# Patient Record
Sex: Male | Born: 1970 | Race: White | Hispanic: No | Marital: Married | Smoking: Never smoker
Health system: Southern US, Community
[De-identification: ages and names within clinical notes are randomized; demographics above are authoritative.]

## PROBLEM LIST (undated history)

## (undated) DIAGNOSIS — M545 Low back pain, unspecified: Secondary | ICD-10-CM

## (undated) DIAGNOSIS — G8929 Other chronic pain: Secondary | ICD-10-CM

## (undated) DIAGNOSIS — Z9109 Other allergy status, other than to drugs and biological substances: Secondary | ICD-10-CM

## (undated) HISTORY — DX: Low back pain, unspecified: M54.50

## (undated) HISTORY — DX: Low back pain: M54.5

## (undated) HISTORY — PX: NO PAST SURGERIES: SHX2092

## (undated) HISTORY — DX: Other chronic pain: G89.29

---

## 2002-01-07 ENCOUNTER — Encounter: Payer: Self-pay | Admitting: Emergency Medicine

## 2002-01-07 ENCOUNTER — Emergency Department (HOSPITAL_COMMUNITY): Admission: EM | Admit: 2002-01-07 | Discharge: 2002-01-08 | Payer: Self-pay | Admitting: Emergency Medicine

## 2007-05-14 ENCOUNTER — Emergency Department (HOSPITAL_COMMUNITY): Admission: EM | Admit: 2007-05-14 | Discharge: 2007-05-14 | Payer: Self-pay | Admitting: Emergency Medicine

## 2012-02-28 ENCOUNTER — Encounter (HOSPITAL_BASED_OUTPATIENT_CLINIC_OR_DEPARTMENT_OTHER): Payer: Self-pay | Admitting: *Deleted

## 2012-02-28 ENCOUNTER — Emergency Department (INDEPENDENT_AMBULATORY_CARE_PROVIDER_SITE_OTHER): Payer: BC Managed Care – PPO

## 2012-02-28 ENCOUNTER — Emergency Department (HOSPITAL_BASED_OUTPATIENT_CLINIC_OR_DEPARTMENT_OTHER)
Admission: EM | Admit: 2012-02-28 | Discharge: 2012-02-28 | Disposition: A | Discharge status: Home / Self Care | Payer: BC Managed Care – PPO | Attending: Emergency Medicine | Admitting: Emergency Medicine

## 2012-02-28 DIAGNOSIS — S0190XA Unspecified open wound of unspecified part of head, initial encounter: Secondary | ICD-10-CM

## 2012-02-28 DIAGNOSIS — R220 Localized swelling, mass and lump, head: Secondary | ICD-10-CM

## 2012-02-28 DIAGNOSIS — W2209XA Striking against other stationary object, initial encounter: Secondary | ICD-10-CM

## 2012-02-28 DIAGNOSIS — S0100XA Unspecified open wound of scalp, initial encounter: Secondary | ICD-10-CM | POA: Insufficient documentation

## 2012-02-28 DIAGNOSIS — S0101XA Laceration without foreign body of scalp, initial encounter: Secondary | ICD-10-CM

## 2012-02-28 DIAGNOSIS — J45909 Unspecified asthma, uncomplicated: Secondary | ICD-10-CM | POA: Insufficient documentation

## 2012-02-28 MED ORDER — LIDOCAINE-EPINEPHRINE-TETRACAINE (LET) SOLUTION
3.0000 mL | Freq: Once | NASAL | Status: AC
  Administered 2012-02-28: 3 mL via TOPICAL
  Filled 2012-02-28 (×2): qty 3

## 2012-02-28 MED ORDER — LIDOCAINE-EPINEPHRINE 2 %-1:100000 IJ SOLN
INTRAMUSCULAR | Status: AC
  Administered 2012-02-28: 17:00:00 via SUBCUTANEOUS
  Filled 2012-02-28: qty 1

## 2012-02-28 MED ORDER — TETANUS-DIPHTH-ACELL PERTUSSIS 5-2.5-18.5 LF-MCG/0.5 IM SUSP
INTRAMUSCULAR | Status: AC
  Filled 2012-02-28: qty 0.5

## 2012-02-28 MED ORDER — TETANUS-DIPHTH-ACELL PERTUSSIS 5-2.5-18.5 LF-MCG/0.5 IM SUSP
0.5000 mL | Freq: Once | INTRAMUSCULAR | Status: AC
  Administered 2012-02-28: 0.5 mL via INTRAMUSCULAR

## 2012-02-28 NOTE — ED Provider Notes (Signed)
History     CSN: [id number]  Arrival date & time 02/28/12  1538   First MD Initiated Contact with Patient 02/28/12 1541      Chief Complaint  Patient presents with  . Head Laceration    Pt. had no LOC and reports hitting the top of the head with a doorframe    (Consider location/radiation/quality/duration/timing/severity/associated sxs/prior treatment) Patient is a 41 y.o. male presenting with scalp laceration. The history is provided by the patient. No language interpreter was used.  Head Laceration This is a new problem. The current episode started today. The problem occurs constantly. The problem has been unchanged. Associated symptoms comments: bleeding. The symptoms are aggravated by nothing. He has tried nothing for the symptoms. The treatment provided no relief.  Pt hit head on doorframe.  Pt was on lawnmower.  Pt reports had panic attack at [facility].  Pt feels better now.  Pt reports he hates doctors  Past Medical History  Diagnosis Date  . Asthma     No past surgical history on file.  No family history on file.  History  Substance Use Topics  . Smoking status: Not on file  . Smokeless tobacco: Not on file  . Alcohol Use:       Review of Systems  Skin: Positive for wound.  All other systems reviewed and are negative.    Allergies  Review of patient's allergies indicates no known allergies.  Home Medications   Current Outpatient Rx  Name Route Sig Dispense Refill  . DM-GUAIFENESIN ER 30-600 MG PO TB12 Oral Take 1 tablet by mouth every 12 (twelve) hours.      BP 139/67  Pulse 64  Temp(Src) 98.2 F (36.8 C) (Oral)  Resp 20  Ht 5\' 10"  (1.778 m)  Wt 280 lb (127.007 kg)  BMI 40.18 kg/m2  SpO2 99%  Physical Exam  Nursing note and vitals reviewed. Constitutional: He is oriented to person, place, and time. He appears well-developed and well-nourished.  HENT:  Head: Normocephalic and atraumatic.  Left Ear: External ear normal.  Nose: Nose normal.   Mouth/Throat: Oropharynx is clear and moist.  Eyes: Pupils are equal, round, and reactive to light.  Neurological: He is alert and oriented to person, place, and time.  Skin: Skin is warm.       3cm laceration anterior scalp    ED Course  LACERATION REPAIR Performed by: Elson Areas Authorized by: Elson Areas Consent: Verbal consent obtained. Risks and benefits: risks, benefits and alternatives were discussed Consent given by: patient Patient understanding: patient states understanding of the procedure being performed Time out: Immediately prior to procedure a "time out" was called to verify the correct patient, procedure, equipment, support staff and site/side marked as required. Body area: head/neck Laceration length: 3 cm Foreign bodies: no foreign bodies Tendon involvement: none Nerve involvement: none Anesthesia: local infiltration Local anesthetic: lidocaine 2% with epinephrine Patient sedated: no Preparation: Patient was prepped and draped in the usual sterile fashion. Irrigation solution: saline Amount of cleaning: standard Debridement: none Skin closure: staples Number of sutures: 4 Patient tolerance: Patient tolerated the procedure well with no immediate complications.   (including critical care time)  Labs Reviewed - No data to display Ct Head Wo Contrast  02/28/2012  *RADIOLOGY REPORT*  Clinical Data: Head laceration  CT HEAD WITHOUT CONTRAST  Technique:  Contiguous axial images were obtained from the base of the skull through the vertex without contrast.  Comparison: None.  Findings:  A  bandage overlies the midline of the forehead.  This finding is associated with a minimal amount of soft tissue stranding (image 26, series 2).  No calvarial fracture.  No radiopaque foreign body. Gray white differentiation is maintained.  No CT evidence of acute large territory infarct.  No intraparenchymal or extra-axial mass or hemorrhage.  Normal size and configuration of  the ventricles and basilar cisterns.  No midline shift.  The paranasal sinuses and mastoid air cells are normal.  IMPRESSION: Minimal soft tissue swelling about the forehead without associated radiopaque foreign body, calvarial fracture or acute intracranial process.  Original Report Authenticated By: Waynard Reeds, M.D.     No diagnosis found.    MDM  Ct obtained due to possible loc.  Normal per radiologist.         Elson Areas, PA 02/28/12 1711

## 2012-02-28 NOTE — ED Notes (Signed)
No LOC with injury to top of head.  Noted scalp wound to the top of the head with controlled bleeding.  Pt. Reports he felt like he lost his vision for a second or two.  Pt. In no distress and did have a panic attack at [facility].

## 2012-02-28 NOTE — Discharge Instructions (Signed)
Head Injury, Adult You have had a head injury that does not appear serious at this time. A concussion is a state of changed mental ability, usually from a blow to the head. You should take clear liquids for the rest of the day and then resume your regular diet. You should not take sedatives or alcoholic beverages for as long as directed by your caregiver after discharge. After injuries such as yours, most problems occur within the first 24 hours. SYMPTOMS These minor symptoms may be experienced after discharge:  Memory difficulties.   Dizziness.   Headaches.   Double vision.   Hearing difficulties.   Depression.   Tiredness.   Weakness.   Difficulty with concentration.  If you experience any of these problems, you should not be alarmed. A concussion requires a few days for recovery. Many patients with head injuries frequently experience such symptoms. Usually, these problems disappear without medical care. If symptoms last for more than one day, notify your caregiver. See your caregiver sooner if symptoms are becoming worse rather than better. HOME CARE INSTRUCTIONS   During the next 24 hours you must stay with someone who can watch you for the warning signs listed below.  Although it is unlikely that serious side effects will occur, you should be aware of signs and symptoms which may necessitate your return to this location. Side effects may occur up to 7 - 10 days following the injury. It is important for you to carefully monitor your condition and contact your caregiver or seek immediate medical attention if there is a change in your condition. SEEK IMMEDIATE MEDICAL CARE IF:   There is confusion or drowsiness.   You can not awaken the injured person.   There is nausea (feeling sick to your stomach) or continued, forceful vomiting.   You notice dizziness or unsteadiness which is getting worse, or inability to walk.   You have convulsions or unconsciousness.   You experience  severe, persistent headaches not relieved by over-the-counter or prescription medicines for pain. (Do not take aspirin as this impairs clotting abilities). Take other pain medications only as directed.   You can not use arms or legs normally.   There is clear or bloody discharge from the nose or ears.  MAKE SURE YOU:   Understand these instructions.   Will watch your condition.   Will get help right away if you are not doing well or get worse.  Document Released: 11/05/2005 Document Revised: 10/25/2011 Document Reviewed: 09/23/2009 [facility] Patient Information 2012 [place], [place].Staple Care and Removal Your caregiver has used staples today to repair your wound. Staples are used to help a wound heal faster by holding the edges of the wound together. The staples can be removed when the wound has healed well enough to stay together after the staples are removed. A dressing (wound covering), depending on the location of the wound, may have been applied. This may be changed once per day or as instructed. If the dressing sticks, it may be soaked off with soapy water or hydrogen peroxide. Only take over-the-counter or prescription medicines for pain, discomfort, or fever as directed by your caregiver.  If you did not receive a tetanus shot today because you did not recall when your last one was given, check with your caregiver when you have your staples removed to determine if one is needed. Return to your caregiver's office in 1 week or as suggested to have your staples removed. SEEK IMMEDIATE MEDICAL CARE IF:   You  have redness, swelling, or increasing pain in the wound.   You have pus coming from the wound.   You have a fever.   You notice a bad smell coming from the wound or dressing.   Your wound edges break open after staples have been removed.  Document Released: 07/31/2001 Document Revised: 10/25/2011 Document Reviewed: 08/15/2005 [facility] Patient Information 2012 [place], [place].

## 2012-02-28 NOTE — ED Notes (Signed)
Pt. Was seen at [facility] and having a total panic attack when the Dr. Was in to see the Pt.

## 2012-02-29 NOTE — ED Provider Notes (Signed)
Medical screening examination/treatment/procedure(s) were performed by non-physician practitioner and as supervising physician I was immediately available for consultation/collaboration.  Cyndra Numbers, MD 02/29/12 [phone]

## 2012-03-07 ENCOUNTER — Emergency Department (HOSPITAL_BASED_OUTPATIENT_CLINIC_OR_DEPARTMENT_OTHER)
Admission: EM | Admit: 2012-03-07 | Discharge: 2012-03-07 | Disposition: A | Discharge status: Home / Self Care | Payer: BC Managed Care – PPO | Attending: Emergency Medicine | Admitting: Emergency Medicine

## 2012-03-07 ENCOUNTER — Encounter (HOSPITAL_BASED_OUTPATIENT_CLINIC_OR_DEPARTMENT_OTHER): Payer: Self-pay

## 2012-03-07 DIAGNOSIS — Z4802 Encounter for removal of sutures: Secondary | ICD-10-CM | POA: Insufficient documentation

## 2012-03-07 HISTORY — DX: Other allergy status, other than to drugs and biological substances: Z91.09

## 2012-03-07 NOTE — ED Notes (Signed)
Pt states that he presents today for staple removal, states that the site remains tender.

## 2012-03-07 NOTE — ED Notes (Signed)
Pt was found in the waiting room arguing with registration clerk about having to pay another emergency room visit charge to have his staples removed.  Pt was informed that this was standard procedure that the he was welcome to check out and go to a local urgent care if he preferred to avoid the emergency room copayment for treatment today.  Pt states that he wanted to proceed with the visit and have the staples removed.  Pt continued to be argumentative with this RN and NP while triage procedure was taking place.  NP chairside in triage removing staples during triage process.

## 2012-03-07 NOTE — ED Provider Notes (Signed)
History     CSN: [id number]  Arrival date & time 03/07/12  1600   First MD Initiated Contact with Patient 03/07/12 1606      Chief Complaint  Patient presents with  . Suture / Staple Removal    (Consider location/radiation/quality/duration/timing/severity/associated sxs/prior treatment) Patient is a 41 y.o. male presenting with suture removal. The history is provided by the patient. No language interpreter was used.  Suture / Staple Removal  The sutures were placed 7 to 10 days ago. Treatments since wound repair include regular soap and water washings. His temperature was unmeasured prior to arrival. There has been no drainage from the wound. There is no redness present. There is no swelling present. The pain has improved. He has no difficulty moving the affected extremity or digit.    Past Medical History  Diagnosis Date  . Asthma   . Environmental allergies     History reviewed. No pertinent past surgical history.  History reviewed. No pertinent family history.  History  Substance Use Topics  . Smoking status: Never Smoker   . Smokeless tobacco: Never Used  . Alcohol Use: No      Review of Systems  Respiratory: Negative.   Cardiovascular: Negative.   Skin: Positive for wound.    Allergies  Review of patient's allergies indicates no known allergies.  Home Medications   Current Outpatient Rx  Name Route Sig Dispense Refill  . ASCORBIC ACID 250 MG PO CHEW Oral Chew 250 mg by mouth daily.    Marland Kitchen DM-GUAIFENESIN ER 30-600 MG PO TB12 Oral Take 1 tablet by mouth every 12 (twelve) hours.      BP 133/93  Pulse 66  Temp 97.5 F (36.4 C)  Resp 20  Ht 5\' 10"  (1.778 m)  Wt 270 lb (122.471 kg)  BMI 38.74 kg/m2  SpO2 97%  Physical Exam  Nursing note and vitals reviewed. Cardiovascular: Normal rate and regular rhythm.   Pulmonary/Chest: Effort normal and breath sounds normal.  Skin:       Well healing wound to the frontal scalp    ED Course  Procedures  (including critical care time)  Labs Reviewed - No data to display No results found.   1. Removal of staple       MDM  4 staples removed without any problem        Teressa Lower, NP 03/07/12 1623

## 2012-03-07 NOTE — Discharge Instructions (Signed)
Staple Removal  Care After  The staples used to close your skin have been removed. The wound needs continued care so it can heal completely and without problems. The care described here will need to be done for another 5-10 days unless your caregiver advises otherwise.   HOME CARE INSTRUCTIONS    Keep wound site dry and clean.   If skin adhesive strips were applied after the staples were removed, they will begin to peel off in a few days. If they remain after fourteen days, they may be peeled off and discarded.   If you still have a dressing, change it at least once a day or as instructed by your caregiver. If the bandage sticks, soak it off with warm water. Pat dry with a clean towel. Look for signs of infection (see below).   Reapply cream or ointment according to your caregiver's instruction. This will help prevent infection and keep the bandage from sticking. Use of a non-stick material over the wound and under the dressing or wrap will also help keep the bandage from sticking.   If the bandage becomes wet, dirty or develops a foul smell, change it as soon as possible.   New scars become sunburned easily. Use sunscreens with protection factor (SPF) of at least 15 when out in the sun.   Only take over-the-counter or prescription medicines for pain, discomfort or fever as directed by your caregiver.  SEEK IMMEDIATE MEDICAL CARE IF:    There is redness, swelling or increasing pain in the wound.   Pus is coming from the wound.   An unexplained oral temperature above 102 F (38.9 C) develops.   You notice a foul smell coming from the wound or dressing.   There is a breaking open of the suture line (edges not staying together) of the wound edges after staples have been removed.  Document Released: 10/18/2008 Document Revised: 10/25/2011 Document Reviewed: 10/18/2008  [employer] Patient Information 2012 [employer].

## 2012-03-08 NOTE — ED Provider Notes (Signed)
Medical screening examination/treatment/procedure(s) were performed by non-physician practitioner and as supervising physician I was immediately available for consultation/collaboration.   Carleene Cooper III, MD 03/08/12 1235

## 2012-06-24 ENCOUNTER — Other Ambulatory Visit: Payer: Self-pay | Admitting: Internal Medicine

## 2012-06-24 ENCOUNTER — Ambulatory Visit: Payer: BC Managed Care – PPO

## 2012-06-24 ENCOUNTER — Ambulatory Visit (INDEPENDENT_AMBULATORY_CARE_PROVIDER_SITE_OTHER): Payer: BC Managed Care – PPO | Admitting: Internal Medicine

## 2012-06-24 ENCOUNTER — Encounter: Payer: Self-pay | Admitting: Internal Medicine

## 2012-06-24 VITALS — BP 126/88 | HR 73 | Temp 98.2°F | Resp 16 | Ht 69.0 in | Wt 284.6 lb

## 2012-06-24 DIAGNOSIS — B351 Tinea unguium: Secondary | ICD-10-CM

## 2012-06-24 DIAGNOSIS — J45909 Unspecified asthma, uncomplicated: Secondary | ICD-10-CM | POA: Insufficient documentation

## 2012-06-24 DIAGNOSIS — M25572 Pain in left ankle and joints of left foot: Secondary | ICD-10-CM

## 2012-06-24 DIAGNOSIS — J309 Allergic rhinitis, unspecified: Secondary | ICD-10-CM | POA: Insufficient documentation

## 2012-06-24 DIAGNOSIS — M25579 Pain in unspecified ankle and joints of unspecified foot: Secondary | ICD-10-CM

## 2012-06-24 DIAGNOSIS — Z6841 Body Mass Index (BMI) 40.0 and over, adult: Secondary | ICD-10-CM | POA: Insufficient documentation

## 2012-06-24 MED ORDER — TERBINAFINE HCL 250 MG PO TABS: 250.0000 mg | ORAL_TABLET | Freq: Every day | ORAL | Status: AC

## 2012-06-24 MED ORDER — ALBUTEROL SULFATE HFA 108 (90 BASE) MCG/ACT IN AERS: 2.0000 | INHALATION_SPRAY | Freq: Four times a day (QID) | RESPIRATORY_TRACT | Status: DC | PRN

## 2012-06-24 MED ORDER — MELOXICAM 15 MG PO TABS: 15.0000 mg | ORAL_TABLET | Freq: Every day | ORAL | Status: AC

## 2012-06-24 NOTE — Progress Notes (Signed)
  Subjective:    Patient ID: Mark Friedman, male    DOB: 22-Nov-1970, 41 y.o.   MRN: [id number]  HPI Complaining of pain in the foot and ankle on and off for the past several months/more recently he stepped funny and now Has pain as he walks or rolls his foot/ worse as the day goes on/job weightbearing No swelling/no history of gout or arthritis/continues to be overweight Prior injury years ago  Review of Systems No other joint abnormalities Still uses medicines when necessary for asthma and allergies Has been gaining weight again over the past 3 months as he has been less mobile No fever or night sweats    Objective:   Physical Exam Vital signs stable The left ankle has good range of motion without pain but inversion and eversion creates pain along the proximal metatarsals Very tender over metatarsals 2,3 and 4 at the base Mild crepitus with tendon movement No swelling or redness Has evidence of peripheral vascular disease with microhemorrhages and hyperpigmentation but has full peripheral pulses Has fungus on 3 toenails of each foot without cellulitis      [facility] reading (PRIMARY) by  Dr. Josephina Gip fx seen foot or ankle    Assessment & Plan:

## 2012-06-24 NOTE — Addendum Note (Signed)
Addended by: Tonye Pearson on: 06/24/2012 01:54 PM   Modules accepted: Orders

## 2013-03-29 IMAGING — CT CT HEAD W/O CM
1 series · 16 of 30 positions shown, 20 images · non-contrast
Comparison: None.

CLINICAL DATA: Head laceration

CT HEAD WITHOUT CONTRAST
TECHNIQUE: Contiguous axial images were obtained from the base of
the skull through the vertex without contrast.

[Series 2: head 4.8 h37s · axial · 0.49mm/px · z∈[+1086,+1242]mm · 16 of 36 slices shown, 20 images]
[im 2/36  brain]
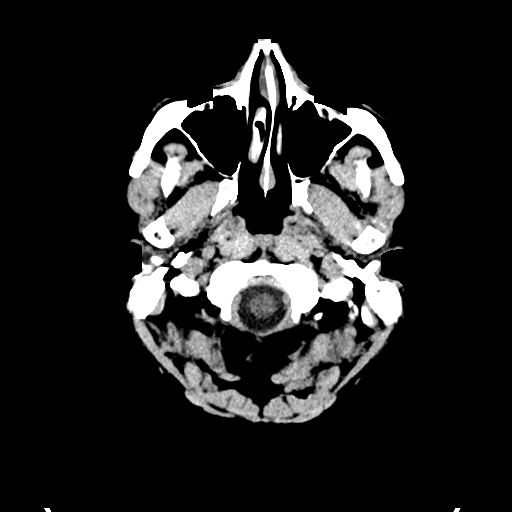
[im 2/36  bone]
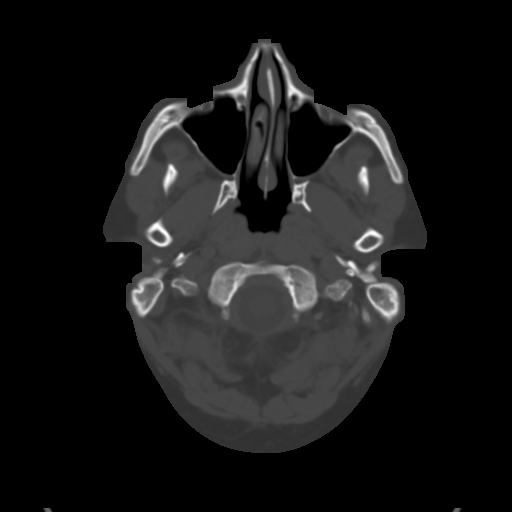
[im 4/36  brain]
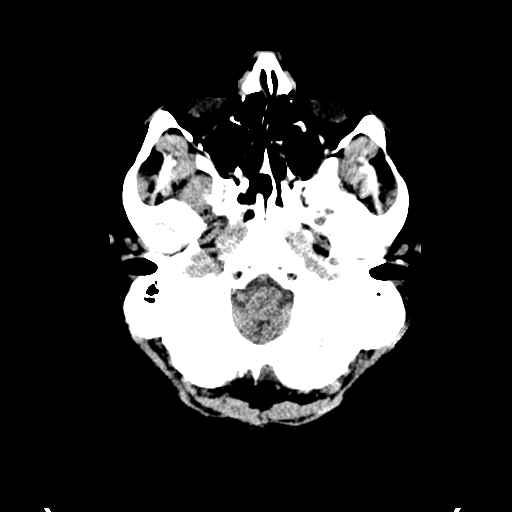
[im 7/36  brain]
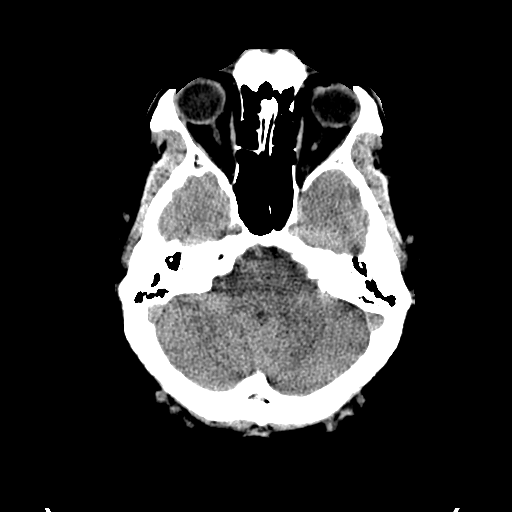
[im 9/36  brain]
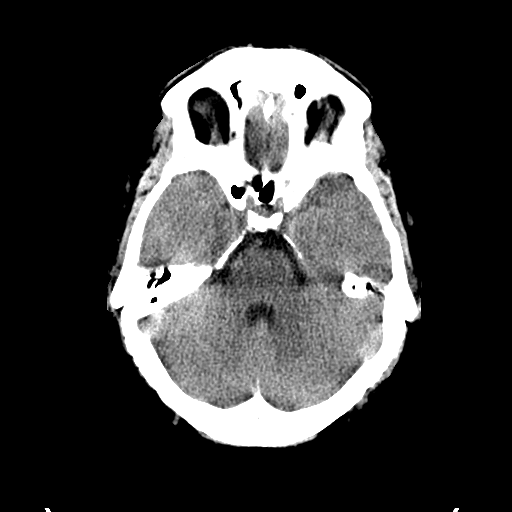
[im 10/36  brain]
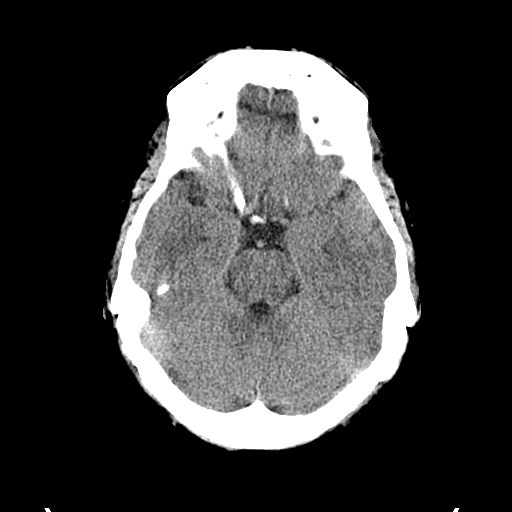
[im 10/36  bone]
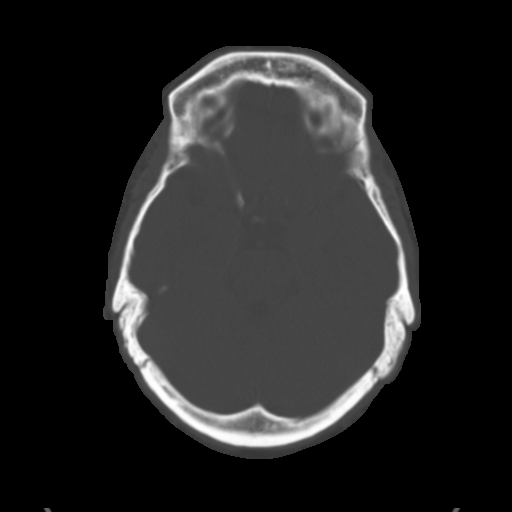
[im 13/36  brain]
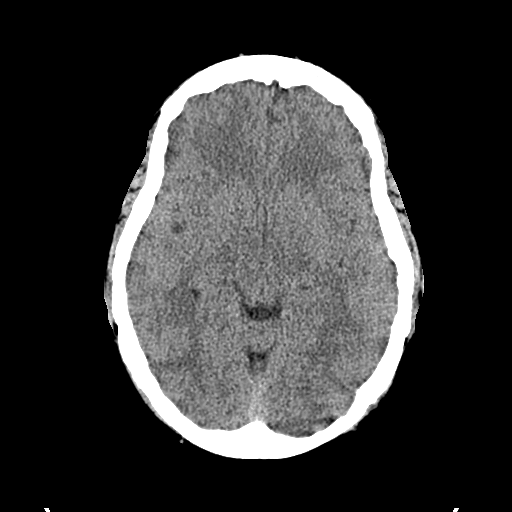
[im 15/36  brain]
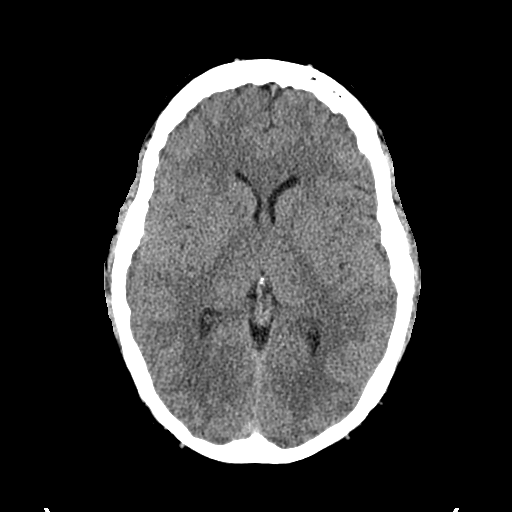
[im 17/36  brain]
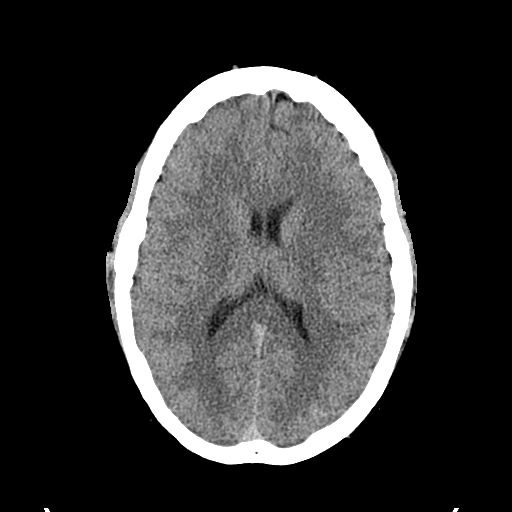
[im 19/36  brain]
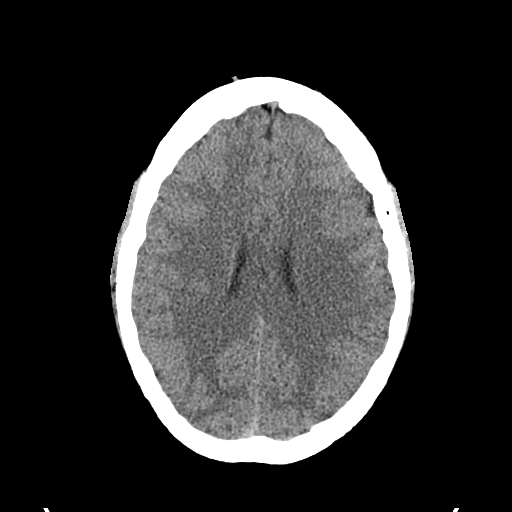
[im 19/36  bone]
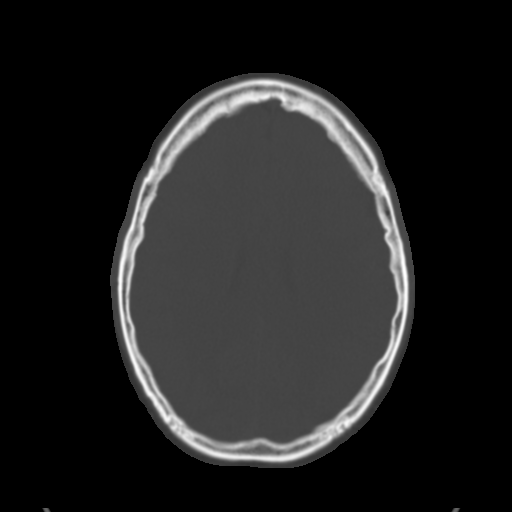
[im 21/36  brain]
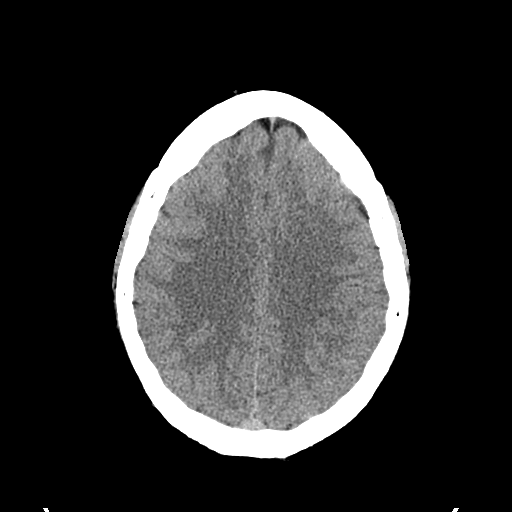
[im 23/36  brain]
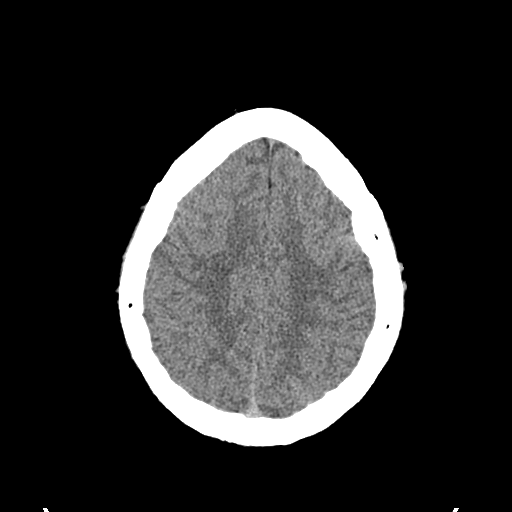
[im 26/36  brain]
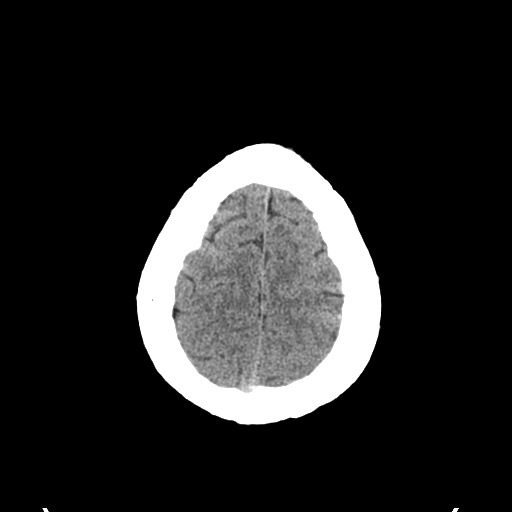
[im 27/36  brain]
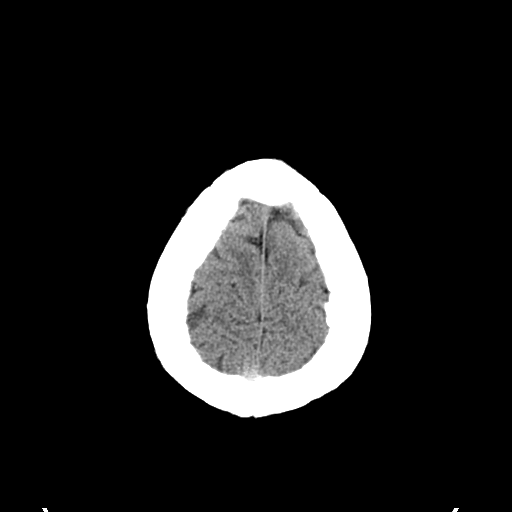
[im 27/36  bone]
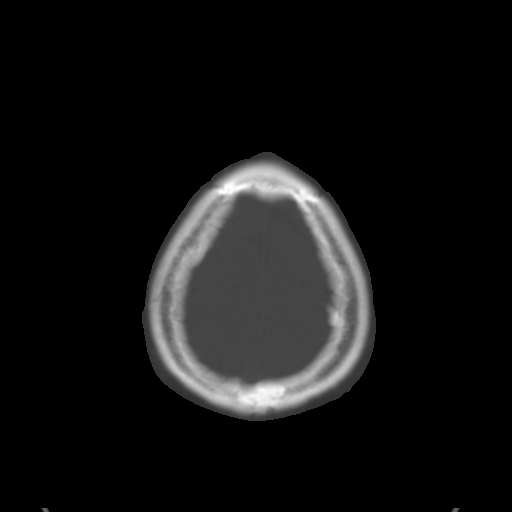
[im 29/36  brain]
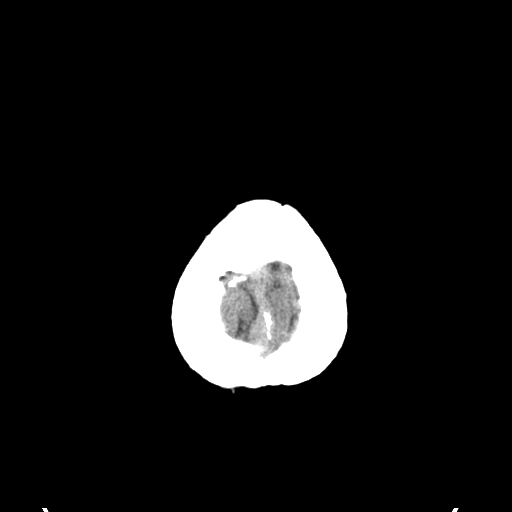
[im 32/36  brain]
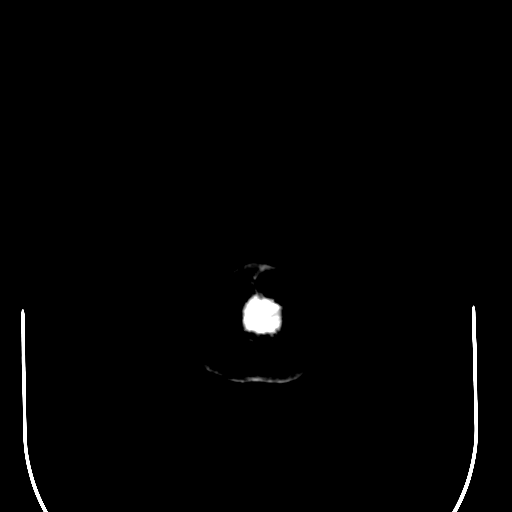
[im 34/36  brain]
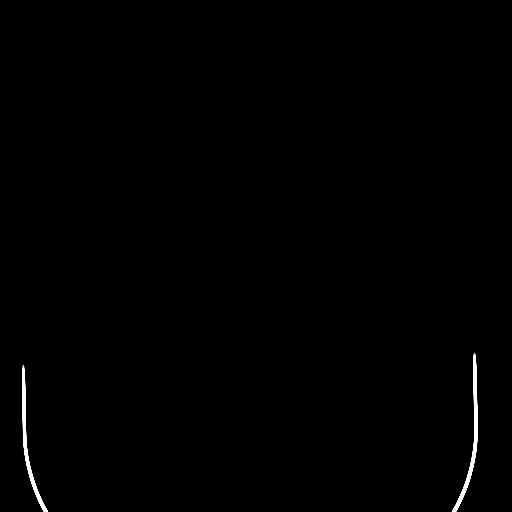

[16 of 30 positions shown; findings below may reference images not displayed]

FINDINGS: A bandage overlies the midline of the forehead.  This finding is
associated with a minimal amount of soft tissue stranding (image
26, series 2).  No calvarial fracture.  No radiopaque foreign body.
Gray white differentiation is maintained.  No CT evidence of acute
large territory infarct.  No intraparenchymal or extra-axial mass
or hemorrhage.  Normal size and configuration of the ventricles and
basilar cisterns.  No midline shift.  The paranasal sinuses and
mastoid air cells are normal.
IMPRESSION: Minimal soft tissue swelling about the forehead without associated
radiopaque foreign body, calvarial fracture or acute intracranial
process.

## 2017-08-09 ENCOUNTER — Emergency Department (HOSPITAL_BASED_OUTPATIENT_CLINIC_OR_DEPARTMENT_OTHER)
Admission: EM | Admit: 2017-08-09 | Discharge: 2017-08-09 | Disposition: A | Discharge status: Home / Self Care | Payer: BLUE CROSS/BLUE SHIELD | Attending: Emergency Medicine | Admitting: Emergency Medicine

## 2017-08-09 ENCOUNTER — Encounter (HOSPITAL_BASED_OUTPATIENT_CLINIC_OR_DEPARTMENT_OTHER): Payer: Self-pay

## 2017-08-09 DIAGNOSIS — M5432 Sciatica, left side: Secondary | ICD-10-CM | POA: Insufficient documentation

## 2017-08-09 DIAGNOSIS — Z79899 Other long term (current) drug therapy: Secondary | ICD-10-CM | POA: Diagnosis not present

## 2017-08-09 DIAGNOSIS — J45909 Unspecified asthma, uncomplicated: Secondary | ICD-10-CM | POA: Insufficient documentation

## 2017-08-09 DIAGNOSIS — M545 Low back pain: Secondary | ICD-10-CM | POA: Diagnosis present

## 2017-08-09 MED ORDER — IBUPROFEN 600 MG PO TABS: 600.0000 mg | ORAL_TABLET | Freq: Four times a day (QID) | ORAL | 0 refills | Status: DC | PRN

## 2017-08-09 MED ORDER — METHYLPREDNISOLONE SODIUM SUCC 125 MG IJ SOLR
125.0000 mg | Freq: Once | INTRAMUSCULAR | Status: AC
  Administered 2017-08-09: 125 mg via INTRAMUSCULAR
  Filled 2017-08-09: qty 2

## 2017-08-09 MED ORDER — KETOROLAC TROMETHAMINE 30 MG/ML IJ SOLN
60.0000 mg | Freq: Once | INTRAMUSCULAR | Status: AC
  Administered 2017-08-09: 60 mg via INTRAMUSCULAR
  Filled 2017-08-09: qty 2

## 2017-08-09 NOTE — ED Notes (Signed)
ED Provider at bedside. 

## 2017-08-09 NOTE — ED Provider Notes (Signed)
[facility] Provider Note   CSN: [id number] Arrival date & time: 08/09/17  1523     History   Chief Complaint Chief Complaint  Patient presents with  . Back Pain    HPI  Mark Friedman is a 46 y.o. Male presents with low back pain for 3 days. 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 Back pain starts just to the left of the spine and then shoots down the leg. No pain on the right side. Patient is able to walk, but with pain, has been using a cane. Patient has not tried any medications at home to treat his symptoms. Denies weakness, numbness, loss of bowel/bladder function or saddle anesthesia. No history of cancer or IV drug use. Denies other health problems, but does not regularly see a provider.        Past Medical History:  Diagnosis Date  . Asthma   . Environmental allergies     Patient Active Problem List   Diagnosis Date Noted  . BMI 40.0-44.9, adult (HCC) 06/24/2012  . Asthma 06/24/2012  . AR (allergic rhinitis) 06/24/2012    History reviewed. No pertinent surgical history.     Home Medications    Prior to Admission medications   Medication Sig Start Date End Date Taking? Authorizing Provider  albuterol (PROVENTIL HFA;VENTOLIN HFA) 108 (90 BASE) MCG/ACT inhaler Inhale 2 puffs into the lungs every 6 (six) hours as needed for wheezing. 06/24/12 06/24/13  Tonye Pearson, MD  ascorbic acid (VITAMIN C) 250 MG CHEW Chew 250 mg by mouth daily.    [provider]  dextromethorphan-guaiFENesin (MUCINEX DM) 30-600 MG per 12 hr tablet Take 1 tablet by mouth every 12 (twelve) hours.    [provider]    Family History No family history on file.  Social History Social History  Substance Use Topics  . Smoking status: Never Smoker  . Smokeless tobacco: Never Used  . Alcohol use No     Allergies   Patient has no known allergies.   Review of  Systems Review of Systems  Constitutional: Negative for chills and fever.  Gastrointestinal: Negative for abdominal pain.  Genitourinary: Negative for difficulty urinating and dysuria.  Musculoskeletal: Positive for back pain. Negative for arthralgias and myalgias.  Skin: Negative for pallor and rash.  Neurological: Negative for weakness, numbness and headaches.     Physical Exam Updated Vital Signs BP (!) 127/111 (BP Location: Right Arm)   Pulse 88   Temp 97.7 F (36.5 C) (Oral)   Resp (!) 22   Ht  (1.778 m)   Wt 104.3 kg (230 lb)   SpO2 100%   BMI 33.00 kg/m   Physical Exam  Constitutional: He appears well-developed and well-nourished. No distress.  HENT:  Head: Normocephalic and atraumatic.  Eyes: Right eye exhibits no discharge. Left eye exhibits no discharge.  Pulmonary/Chest: Effort normal. No respiratory distress.  Abdominal: Soft. Bowel sounds are normal. There is no tenderness. There is no guarding.  Musculoskeletal:  Tender to palpation just to the left of the lumbar spine and continuing down to the gluteus, nontender at the midline, no tenderness on the right side. Mobility limited by pain  Neurological: He is alert. Coordination normal.  Speech is clear, able to follow commands CN III-XII intact Normal strength in upper and lower extremities bilaterally including dorsiflexion and plantar flexion, strong and equal grip strength Sensation normal to  light and sharp touch Moves extremities without ataxia, coordination intact   Skin: Skin is warm and dry. Capillary refill takes less than 2 seconds. No rash noted. He is not diaphoretic. No erythema.  Psychiatric: He has a normal mood and affect. His behavior is normal.  Nursing note and vitals reviewed.    ED Treatments / Results  Labs (all labs ordered are listed, but only abnormal results are displayed) Labs Reviewed - No data to display  EKG  EKG Interpretation None       Radiology No results  found.  Procedures Procedures (including critical care time)  Medications Ordered in ED Medications  ketorolac (TORADOL) 30 MG/ML injection 60 mg (60 mg Intramuscular Given 08/09/17 1820)  methylPREDNISolone sodium succinate (SOLU-MEDROL) 125 mg/2 mL injection 125 mg (125 mg Intramuscular Given 08/09/17 1817)     Initial Impression / Assessment and Plan / ED Course  I have reviewed the triage vital signs and the nursing notes.  Pertinent labs & imaging results that were available during my care of the patient were reviewed by me and considered in my medical decision making (see chart for details).  Normal neurological exam, no evidence of urinary incontinence or retention, pain is consistently reproducible. There is no evidence of AAA or concern for dissection at this time. Patient can walk but states is painful.  No loss of bowel or bladder control.  No concern for cauda equina.  No fever, night sweats, weight loss, h/o cancer, IVDU.  Pain treated here in the department with Toradol and solumedrol with adequate improvement, mobility improved. RICE protocol and pain medicine indicated and discussed with patient. I have also discussed reasons to return immediately to the ER.  Patient expresses understanding and agrees with plan.   Final Clinical Impressions(s) / ED Diagnoses   Final diagnoses:  Sciatica of left side    New Prescriptions Discharge Medication List as of 08/09/2017  7:50 PM    START taking these medications   Details  ibuprofen (ADVIL,MOTRIN) 600 MG tablet Take 1 tablet (600 mg total) by mouth every 6 (six) hours as needed., Starting Fri 08/09/2017, Print         Dartha Lodge, PA-C 08/10/17 0130    Rolland Porter, MD 08/20/17 2049

## 2017-08-09 NOTE — Discharge Instructions (Signed)
You can take ibuprofen to help manage your pain, you may also use heat and ice as well as the back exercises provided. You can follow-up with Dr. Pearletha Forge with sports medicine, and use the phone number provided and your paperwork to set up care with primary doctor. Please return to the emergency department if you lose control of her bowel or bladder, or unable to walk or experiencing numbness in your legs.

## 2017-08-09 NOTE — ED Notes (Signed)
Pt verbalizes understanding of d/c instructions and denies any further needs at this time. 

## 2017-08-09 NOTE — ED Triage Notes (Signed)
Pt reports lower back pain, that worsened today. Pt reports heavy lifting at work.

## 2017-08-12 ENCOUNTER — Encounter: Payer: Self-pay | Admitting: Family Medicine

## 2017-08-12 ENCOUNTER — Ambulatory Visit (INDEPENDENT_AMBULATORY_CARE_PROVIDER_SITE_OTHER): Payer: BLUE CROSS/BLUE SHIELD | Admitting: Family Medicine

## 2017-08-12 DIAGNOSIS — M545 Low back pain, unspecified: Secondary | ICD-10-CM

## 2017-08-12 DIAGNOSIS — M79605 Pain in left leg: Principal | ICD-10-CM

## 2017-08-12 MED ORDER — PREDNISONE 10 MG PO TABS: ORAL_TABLET | ORAL | 0 refills | Status: DC

## 2017-08-12 NOTE — Patient Instructions (Signed)
You have lumbar radiculopathy (a pinched nerve in your low back). Take tylenol for baseline pain relief (1-2 extra strength tabs 3x/day) A prednisone dose pack is the best option for immediate relief and may be prescribed. Day after finishing prednisone ok to start taking ibuprofen  three times a day OR aleve 2 tabs twice a day with food for pain and inflammation. Continue using the topical medicine up to 4 times a day. Stay as active as possible. Physical therapy has been shown to be helpful as well but don't start exercises for another week until we're sure you're improving with the medicine. Strengthening of low back muscles, abdominal musculature are key for long term pain relief. If not improving, will consider further imaging (MRI). Either call me in a week to let me know how you're doing or follow up with me in 1 week.

## 2017-08-14 ENCOUNTER — Telehealth: Payer: Self-pay | Admitting: Family Medicine

## 2017-08-14 NOTE — Telephone Encounter (Signed)
Patient called stating he is not getting better. He would like to go ahead and get an MRI. He is coming in Monday, 10/1 for his 1wk f/u

## 2017-08-15 DIAGNOSIS — G8929 Other chronic pain: Secondary | ICD-10-CM | POA: Insufficient documentation

## 2017-08-15 DIAGNOSIS — M545 Low back pain: Secondary | ICD-10-CM | POA: Insufficient documentation

## 2017-08-15 DIAGNOSIS — M79605 Pain in left leg: Principal | ICD-10-CM

## 2017-08-15 NOTE — Telephone Encounter (Signed)
I would give the prednisone more than a couple days to see if it works - that's why we usually wait until a week out to reevaluate and go ahead with MRI if not improving.  In the absence of any weakness on exam insurance usually requires we give it a full course of prednisone before they'll approve an MRI.

## 2017-08-15 NOTE — Assessment & Plan Note (Signed)
consistent with lumbar radiculopathy though exam reassuring.  Start prednisone dose pack.  Tylenol as needed.  Consider physical therapy, MRI depending on improvement over next week.

## 2017-08-15 NOTE — Progress Notes (Signed)
PCP: Patient, No Pcp Per  Subjective:   HPI: Patient is a 46 y.o. male here for low back pain.  Patient denies known injury or trauma. He reports on Friday afternoon he started to get pain in low back more on the left side with radiation down left leg to the ankle. Pain level 10/10 and sharp. Associated numbness down past the knee. Noticed also when using his lawnmower. Has been taking extra strength tylenol. Given two shots in the ED. No bowel/bladder dysfunction. No skin changes.  Past Medical History:  Diagnosis Date  . Asthma   . Environmental allergies     Current Outpatient Prescriptions on File Prior to Visit  Medication Sig Dispense Refill  . albuterol (PROVENTIL HFA;VENTOLIN HFA) 108 (90 BASE) MCG/ACT inhaler Inhale 2 puffs into the lungs every 6 (six) hours as needed for wheezing. 1 Inhaler 2  . ascorbic acid (VITAMIN C) 250 MG CHEW Chew 250 mg by mouth daily.    Marland Kitchen dextromethorphan-guaiFENesin (MUCINEX DM) 30-600 MG per 12 hr tablet Take 1 tablet by mouth every 12 (twelve) hours.    Marland Kitchen ibuprofen (ADVIL,MOTRIN) 600 MG tablet Take 1 tablet (600 mg total) by mouth every 6 (six) hours as needed. 30 tablet 0   No current facility-administered medications on file prior to visit.     No past surgical history on file.  No Known Allergies  Social History   Social History  . Marital status: Married    Spouse name: N/A  . Number of children: N/A  . Years of education: N/A   Occupational History  . Not on file.   Social History Main Topics  . Smoking status: Never Smoker  . Smokeless tobacco: Never Used  . Alcohol use No  . Drug use: No  . Sexual activity: Not on file   Other Topics Concern  . Not on file   Social History Narrative  . No narrative on file    No family history on file.  BP (!) 154/112   Pulse 60   Ht  (1.778 m)   Wt 230 lb (104.3 kg)   BMI 33.00 kg/m   Review of Systems: See HPI above.     Objective:  Physical Exam:  Gen:  NAD, comfortable in exam room  Back: No gross deformity, scoliosis. TTP left lumbar paraspinal region.  No midline or bony TTP. FROM. Strength LEs 5/5 all muscle groups.   2+ MSRs in patellar and achilles tendons, equal bilaterally. Negative SLRs. Sensation intact to light touch bilaterally. Negative logroll bilateral hips Negative fabers and piriformis stretches.   Assessment & Plan:  1. Low back pain with radiation to left leg - consistent with lumbar radiculopathy though exam reassuring.  Start prednisone dose pack.  Tylenol as needed.  Consider physical therapy, MRI depending on improvement over next week.

## 2017-08-15 NOTE — Telephone Encounter (Signed)
Spoke to patient and he made an appointment for 10/01. Will reevaluate at visit.

## 2017-08-19 ENCOUNTER — Encounter: Payer: Self-pay | Admitting: Family Medicine

## 2017-08-19 ENCOUNTER — Ambulatory Visit (INDEPENDENT_AMBULATORY_CARE_PROVIDER_SITE_OTHER): Payer: BLUE CROSS/BLUE SHIELD | Admitting: Family Medicine

## 2017-08-19 DIAGNOSIS — M79605 Pain in left leg: Secondary | ICD-10-CM

## 2017-08-19 DIAGNOSIS — M545 Low back pain, unspecified: Secondary | ICD-10-CM

## 2017-08-19 NOTE — Patient Instructions (Signed)
We will go ahead with an MRI of your lumbar spine given you're still having numbness, decreased patellar reflex, and some weakness of hip flexion and knee extension of your left leg. I will call you the business day following this to go over results. Ok to take tylenol and ibuprofen in the meantime. Use the topical medicine 4 times a day.

## 2017-08-20 NOTE — Progress Notes (Addendum)
PCP: Patient, No Pcp Per  Subjective:   HPI: Patient is a 46 y.o. male here for low back pain.  9/24: Patient denies known injury or trauma. He reports on Friday afternoon he started to get pain in low back more on the left side with radiation down left leg to the ankle. Pain level 10/10 and sharp. Associated numbness down past the knee. Noticed also when using his lawnmower. Has been taking extra strength tylenol. Given two shots in the ED. No bowel/bladder dysfunction. No skin changes.  10/1: Patient reports he feels his low back is better but leg still bothering him. Prednisone helped. Rarely taking ibuprofen. Feels left leg down to knee is numb with stiffness and soreness. Gets a twitch in back at times on left side. Left leg feels weak. No bowel/bladder dysfunction.  Past Medical History:  Diagnosis Date  . Asthma   . Environmental allergies     Current Outpatient Prescriptions on File Prior to Visit  Medication Sig Dispense Refill  . albuterol (PROVENTIL HFA;VENTOLIN HFA) 108 (90 BASE) MCG/ACT inhaler Inhale 2 puffs into the lungs every 6 (six) hours as needed for wheezing. 1 Inhaler 2  . ascorbic acid (VITAMIN C) 250 MG CHEW Chew 250 mg by mouth daily.    Marland Kitchen dextromethorphan-guaiFENesin (MUCINEX DM) 30-600 MG per 12 hr tablet Take 1 tablet by mouth every 12 (twelve) hours.    Marland Kitchen ibuprofen (ADVIL,MOTRIN) 600 MG tablet Take 1 tablet (600 mg total) by mouth every 6 (six) hours as needed. 30 tablet 0  . predniSONE (DELTASONE) 10 MG tablet 6 tabs po day 1, 5 tabs po day 2, 4 tabs po day 3, 3 tabs po day 4, 2 tabs po day 5, 1 tab po day 6 21 tablet 0   No current facility-administered medications on file prior to visit.     No past surgical history on file.  No Known Allergies  Social History   Social History  . Marital status: Married    Spouse name: N/A  . Number of children: N/A  . Years of education: N/A   Occupational History  . Not on file.   Social  History Main Topics  . Smoking status: Never Smoker  . Smokeless tobacco: Never Used  . Alcohol use No  . Drug use: No  . Sexual activity: Not on file   Other Topics Concern  . Not on file   Social History Narrative  . No narrative on file    No family history on file.  BP 104/68   Pulse 63   Review of Systems: See HPI above.     Objective:  Physical Exam:  Gen: NAD, comfortable in exam room.  Back: No gross deformity, scoliosis. TTP mildly left lumbar paraspinal region.  No midline or bony TTP. FROM. Strength 4/5 left knee extension, 5-/5 left hip flexion.  5/5 other motions.   1+ left patellar MSR.  2+ MSRs in right patellar and bilateral achilles tendons. Negative SLRs. Sensation intact to light touch bilaterally. Negative logroll bilateral hips   Assessment & Plan:  1. Low back pain with radiation to left leg - consistent with lumbar radiculopathy.  Worsening now as he has some weakness and decreased patellar reflex despite pain level feeling improved.  Will go ahead with MRI to further assess.  Tylenol, ibuprofen in meantime.  topical biofreeze 4 times a day. S/p prednisone.  Addendum: MRI reviewed and discussed with patient.  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  We discussed options - email written as well.  He is going to discuss with his wife and consider ESI, neurosurgery referral.

## 2017-08-20 NOTE — Assessment & Plan Note (Signed)
consistent with lumbar radiculopathy.  Worsening now as he has some weakness and decreased patellar reflex despite pain level feeling improved.  Will go ahead with MRI to further assess.  Tylenol, ibuprofen in meantime.  topical biofreeze 4 times a day. S/p prednisone.

## 2017-08-22 NOTE — Addendum Note (Signed)
Addended by: Kathi Simpers F on: 08/22/2017 01:09 PM   Modules accepted: Orders

## 2017-08-26 ENCOUNTER — Ambulatory Visit (INDEPENDENT_AMBULATORY_CARE_PROVIDER_SITE_OTHER): Payer: BLUE CROSS/BLUE SHIELD

## 2017-08-26 DIAGNOSIS — M545 Low back pain, unspecified: Secondary | ICD-10-CM

## 2017-08-26 DIAGNOSIS — M5116 Intervertebral disc disorders with radiculopathy, lumbar region: Secondary | ICD-10-CM

## 2017-08-26 DIAGNOSIS — M79605 Pain in left leg: Principal | ICD-10-CM

## 2017-08-29 ENCOUNTER — Encounter: Payer: Self-pay | Admitting: Family Medicine

## 2017-09-02 ENCOUNTER — Other Ambulatory Visit: Payer: Self-pay | Admitting: Family Medicine

## 2017-09-02 ENCOUNTER — Other Ambulatory Visit: Payer: BLUE CROSS/BLUE SHIELD

## 2017-09-02 DIAGNOSIS — M545 Low back pain: Principal | ICD-10-CM

## 2017-09-02 DIAGNOSIS — G8929 Other chronic pain: Secondary | ICD-10-CM

## 2017-09-10 ENCOUNTER — Ambulatory Visit
Admission: RE | Admit: 2017-09-10 | Discharge: 2017-09-10 | Disposition: A | Discharge status: Home / Self Care | Payer: BLUE CROSS/BLUE SHIELD | Source: Ambulatory Visit | Attending: Family Medicine | Admitting: Family Medicine

## 2017-09-10 DIAGNOSIS — M545 Low back pain: Principal | ICD-10-CM

## 2017-09-10 DIAGNOSIS — G8929 Other chronic pain: Secondary | ICD-10-CM

## 2017-09-10 MED ORDER — IOPAMIDOL (ISOVUE-M 200) INJECTION 41%
1.0000 mL | Freq: Once | INTRAMUSCULAR | Status: AC
  Administered 2017-09-10: 1 mL via EPIDURAL

## 2017-09-10 MED ORDER — METHYLPREDNISOLONE ACETATE 40 MG/ML INJ SUSP (RADIOLOG
120.0000 mg | Freq: Once | INTRAMUSCULAR | Status: AC
  Administered 2017-09-10: 120 mg via EPIDURAL

## 2017-09-10 NOTE — Discharge Instructions (Signed)

## 2017-09-16 ENCOUNTER — Ambulatory Visit (INDEPENDENT_AMBULATORY_CARE_PROVIDER_SITE_OTHER): Payer: BLUE CROSS/BLUE SHIELD | Admitting: Family Medicine

## 2017-09-16 ENCOUNTER — Encounter: Payer: Self-pay | Admitting: Family Medicine

## 2017-09-16 VITALS — BP 122/80 | HR 69 | Temp 98.3°F | Ht 70.0 in | Wt 228.2 lb

## 2017-09-16 DIAGNOSIS — M545 Low back pain: Secondary | ICD-10-CM

## 2017-09-16 DIAGNOSIS — M79605 Pain in left leg: Secondary | ICD-10-CM

## 2017-09-16 DIAGNOSIS — Z9109 Other allergy status, other than to drugs and biological substances: Secondary | ICD-10-CM | POA: Insufficient documentation

## 2017-09-16 DIAGNOSIS — I872 Venous insufficiency (chronic) (peripheral): Secondary | ICD-10-CM

## 2017-09-16 MED ORDER — ALBUTEROL SULFATE HFA 108 (90 BASE) MCG/ACT IN AERS: 2.0000 | INHALATION_SPRAY | Freq: Four times a day (QID) | RESPIRATORY_TRACT | 2 refills | Status: DC | PRN

## 2017-09-16 NOTE — Patient Instructions (Addendum)
Vick's rub over the top of the toenail can be helpful over 9-15 months with daily use.   Schedule a physical at your earliest convenience.  Call Dr. Lazaro ArmsHudnall's office regarding seeing a surgeon if you are interested in this option.   Let me know if you are interested in seeing an allergist or can get the blood testing here.   Let us know if you need anything.

## 2017-09-16 NOTE — Progress Notes (Signed)
Pre visit review using our clinic review tool, if applicable. No additional management support is needed unless otherwise documented below in the visit note. 

## 2017-09-16 NOTE — Progress Notes (Signed)
Chief Complaint  Patient presents with  . Establish Care       New Patient Visit SUBJECTIVE: HPI: Mark MareLewis Schwegel Jr. is an 46 y.o.male who is being seen for establishing care.  The patient was sent down from Dr Lazaro ArmsHudnall's office for a low back issue.  Pt has hx of allergies and allergy induced asthma.  He will use albuterol intermittently.  He finds that he requires it less often after losing 100 pounds.  He also has a history of environmental allergies.  He is requesting testing.  He is to see an allergist, however that provider retired.  He does not take anything routinely for his allergies.  He knows that he is allergic to cats, mold, dust, and certain plants.  He denies any swelling or shortness of breath.  He is also dealing with low back pain.  MRI showed herniation.  He recently had a steroid injection that did help with his pain, however not with his weakness.  He is wondering if he needs to see a Careers advisersurgeon.  Per Dr. Lazaro ArmsHudnall's most recent note, injection versus neurosurgery referral was offered.  No Known Allergies  Past Medical History:  Diagnosis Date  . Asthma   . Chronic low back pain   . Environmental allergies    Past Surgical History:  Procedure Laterality Date  . NO PAST SURGERIES     Social History   Social History  . Marital status: Married   Social History Main Topics  . Smoking status: Never Smoker  . Smokeless tobacco: Never Used  . Alcohol use No  . Drug use: No  . Sexual activity: Yes    Partners: Female   Family History  Problem Relation Age of Onset  . Cancer Neg Hx      Current Outpatient Prescriptions:  .  ascorbic acid (VITAMIN C) 250 MG CHEW, Chew 250 mg by mouth daily., Disp: , Rfl:  .  ibuprofen (ADVIL,MOTRIN) 600 MG tablet, Take 1 tablet (600 mg total) by mouth every 6 (six) hours as needed., Disp: 30 tablet, Rfl: 0 .  albuterol (PROVENTIL HFA;VENTOLIN HFA) 108 (90 Base) MCG/ACT inhaler, Inhale 2 puffs into the lungs every 6 (six) hours as  needed for wheezing., Disp: 1 Inhaler, Rfl: 2 .  predniSONE (DELTASONE) 10 MG tablet, 6 tabs po day 1, 5 tabs po day 2, 4 tabs po day 3, 3 tabs po day 4, 2 tabs po day 5, 1 tab po day 6 (Patient not taking: Reported on 09/16/2017), Disp: 21 tablet, Rfl: 0  ROS Neuro: +LLE weakness  Respiratory: Denies dyspnea   OBJECTIVE: BP 122/80 (BP Location: Left Arm, Patient Position: Sitting, Cuff Size: Large)   Pulse 69   Temp 98.3 F (36.8 C) (Oral)   Ht 5\' 10"  (1.778 m)   Wt 228 lb 4 oz (103.5 kg)   SpO2 95%   BMI 32.75 kg/m   Constitutional: -  VS reviewed -  Well developed, well nourished, appears stated age -  No apparent distress  Psychiatric: -  Oriented to person, place, and time -  Memory intact -  Affect and mood normal -  Fluent conversation, good eye contact -  Judgment and insight age appropriate  Eye: -  Conjunctivae clear, no discharge -  Pupils symmetric, round, reactive to light  ENMT: -  MMM    Pharynx moist, no exudate, no erythema -  Ears patent, TM's neg -  Nares patent, no d/c or edema  Neck: -  No gross  swelling, no palpable masses -  Thyroid midline, not enlarged, mobile, no palpable masses  Cardiovascular: -  RRR -  No LE edema  Respiratory: -  Normal respiratory effort, no accessory muscle use, no retraction -  Breath sounds equal, no wheezes, no ronchi, no crackles  Skin: -  +hyperpigmentation on LE's b/l (venous stasis dermatitis) -  Warm and dry to palpation   ASSESSMENT/PLAN: Environmental allergies - Plan: albuterol (PROVENTIL HFA;VENTOLIN HFA) 108 (90 Base) MCG/ACT inhaler, CANCELED: Allergy Panel 11, Mold Group, CANCELED: Allergy Panel, Region 2, Grasses, CANCELED: Allergens (22) Foods, CANCELED: Allergy Panel, Animal Group  Venous stasis dermatitis of both lower extremities  Low back pain radiating to left leg  Refill albuterol for prn use. Offered referral to all/imm vs getting basic labs; he would like to check with insurance and let us  know. Suggested that he contact Dr. Lazaro Arms office if he is interested in discussing surgical referral.  Patient should return at earliest convenience for CPE. The patient voiced understanding and agreement to the plan.    Jilda Roche [place], DO 09/16/17  11:51 AM

## 2017-09-17 ENCOUNTER — Ambulatory Visit (INDEPENDENT_AMBULATORY_CARE_PROVIDER_SITE_OTHER): Payer: BLUE CROSS/BLUE SHIELD | Admitting: Family Medicine

## 2017-09-17 ENCOUNTER — Encounter: Payer: Self-pay | Admitting: Family Medicine

## 2017-09-17 DIAGNOSIS — M79605 Pain in left leg: Secondary | ICD-10-CM

## 2017-09-17 DIAGNOSIS — M545 Low back pain, unspecified: Secondary | ICD-10-CM

## 2017-09-17 NOTE — Progress Notes (Signed)
PCP: Patient, No Pcp Per  Subjective:   HPI: Patient is a 46 y.o. male here for low back pain.  9/24: Patient denies known injury or trauma. He reports on Friday afternoon he started to get pain in low back more on the left side with radiation down left leg to the ankle. Pain level 10/10 and sharp. Associated numbness down past the knee. Noticed also when using his lawnmower. Has been taking extra strength tylenol. Given two shots in the ED. No bowel/bladder dysfunction. No skin changes.  10/1: Patient reports he feels his low back is better but leg still bothering him. Prednisone helped. Rarely taking ibuprofen. Feels left leg down to knee is numb with stiffness and soreness. Gets a twitch in back at times on left side. Left leg feels weak. No bowel/bladder dysfunction.  10/30: Patient reports overall he feels better. Pain has essentially resolved following injection though occasionally will feel pain there if he's 'not paying attention' or moves wrong. Feels strength is improving in left leg, not giving out though still feels weak. Has numbness over anterior knee. No skin changes.  Past Medical History:  Diagnosis Date  . Asthma   . Chronic low back pain   . Environmental allergies     Current Outpatient Prescriptions on File Prior to Visit  Medication Sig Dispense Refill  . albuterol (PROVENTIL HFA;VENTOLIN HFA) 108 (90 Base) MCG/ACT inhaler Inhale 2 puffs into the lungs every 6 (six) hours as needed for wheezing. 1 Inhaler 2  . ascorbic acid (VITAMIN C) 250 MG CHEW Chew 250 mg by mouth daily.    Marland Kitchen ibuprofen (ADVIL,MOTRIN) 600 MG tablet Take 1 tablet (600 mg total) by mouth every 6 (six) hours as needed. 30 tablet 0  . predniSONE (DELTASONE) 10 MG tablet 6 tabs po day 1, 5 tabs po day 2, 4 tabs po day 3, 3 tabs po day 4, 2 tabs po day 5, 1 tab po day 6 (Patient not taking: Reported on 09/16/2017) 21 tablet 0   No current facility-administered medications on file  prior to visit.     Past Surgical History:  Procedure Laterality Date  . NO PAST SURGERIES      No Known Allergies  Social History   Social History  . Marital status: Married    Spouse name: N/A  . Number of children: N/A  . Years of education: N/A   Occupational History  . Not on file.   Social History Main Topics  . Smoking status: Never Smoker  . Smokeless tobacco: Never Used  . Alcohol use No  . Drug use: No  . Sexual activity: Yes    Partners: Female   Other Topics Concern  . Not on file   Social History Narrative  . No narrative on file    Family History  Problem Relation Age of Onset  . Cancer Neg Hx     BP 117/74   Pulse 60   Review of Systems: See HPI above.     Objective:  Physical Exam:  Gen: NAD, comfortable in exam room.  Back: No gross deformity, scoliosis. No TTP .  No midline or bony TTP. FROM. Strength 5-/5 with left hip flexion and knee extension, 5/5 other motions. 1+ left patellar, 2+ MSRs in right patellar and bilateral achilles tendons, equal bilaterally. Negative SLRs. Sensation diminished anterior left knee. Negative logroll bilateral hips   Assessment & Plan:  1. Low back pain with radiation to left leg - large disc herniation at  L3-4 causing L3 and L4 impingment.  Pain has resolved following injection, hip flexion strength has improved.  Discussed neurosurgery referral, repeat injection, physical therapy - will go ahead with physical therapy.  Discussed if strength does not improve in PT or he worsens would recommend referral to neurosurgery.  Tylenol, ibuprofen only if needed.

## 2017-09-17 NOTE — Patient Instructions (Signed)
Your strength and reflex have improved some since the last visit and following the injection. Start physical therapy and do home exercises on days you don't go to therapy. Wear back brace at work if needed. Follow up with me in 6 weeks for reevaluation. If you're not improving as expected we can consider repeating the injection or neurosurgery referral.

## 2017-09-17 NOTE — Assessment & Plan Note (Signed)
large disc herniation at L3-4 causing L3 and L4 impingment.  Pain has resolved following injection, hip flexion strength has improved.  Discussed neurosurgery referral, repeat injection, physical therapy - will go ahead with physical therapy.  Discussed if strength does not improve in PT or he worsens would recommend referral to neurosurgery.  Tylenol, ibuprofen only if needed.

## 2017-09-17 NOTE — Addendum Note (Signed)
Addended by: Kathi SimpersWISE, Bridget Westbrooks F on: 09/17/2017 04:57 PM   Modules accepted: Orders

## 2017-09-25 ENCOUNTER — Telehealth: Payer: Self-pay | Admitting: Family Medicine

## 2017-09-25 NOTE — Telephone Encounter (Signed)
Spoke to patient and gave him information regarding PT. He is requesting a call back from the provider.

## 2017-09-25 NOTE — Telephone Encounter (Signed)
Due to his level of weakness he had frankly I think he needs physical therapy even if he does it less frequently than the usual 2 times a week.    Can you ask what other questions he has?

## 2017-09-25 NOTE — Telephone Encounter (Signed)
Patient calling regarding physical therapy and course of treatment. He would like to know if there are alternative options to physical therapy due to the cost and stated he has another questions that he did not wish to disclose.  Requested a call back from provider

## 2017-09-26 NOTE — Telephone Encounter (Signed)
Spoke with patient - he is going to do single visit of physical therapy (limited due to cost) and do home exercise program, likely join a gym to work on strengthening.  He has follow-up scheduled with us on 12/3 - will keep this appointment with us to reevaluate.

## 2017-10-03 ENCOUNTER — Other Ambulatory Visit: Payer: Self-pay

## 2017-10-03 ENCOUNTER — Encounter: Payer: Self-pay | Admitting: Physical Therapy

## 2017-10-03 ENCOUNTER — Ambulatory Visit: Payer: BLUE CROSS/BLUE SHIELD | Attending: Family Medicine | Admitting: Physical Therapy

## 2017-10-03 DIAGNOSIS — R29898 Other symptoms and signs involving the musculoskeletal system: Secondary | ICD-10-CM

## 2017-10-03 DIAGNOSIS — R293 Abnormal posture: Secondary | ICD-10-CM

## 2017-10-03 DIAGNOSIS — M5442 Lumbago with sciatica, left side: Secondary | ICD-10-CM

## 2017-10-03 NOTE — Patient Instructions (Signed)
Therapeutic - Prone Press-Up    Push up so chest is off floor and back is arched. Do not raise hips. Hold __10__ seconds. Return to prone position. Repeat _10___ times.    Cat / Cow Flow    Inhale, press spine toward ceiling like a Halloween cat. Keeping strength in arms and abdominals, exhale to soften spine through neutral and into cow pose. Open chest and arch back. Initiate movement between cat and cow at tailbone, one vertebrae at a time. Repeat _10___ times.    Lumbar Rotation (Non-Weight Bearing)    Feet on floor, slowly rock knees from side to side in small, pain-free range of motion. Allow lower back to rotate slightly. Repeat _10___ times per set.    Bridging    Slowly raise buttocks from floor, keeping stomach tight. Hold for _3-5_ seconds. Repeat __10__ times per set.     Bridging (Single Leg)    Lie on back with feet shoulder width apart and right leg straight. Lift hips toward the ceiling while keeping leg straight. Hold _3-5__ seconds. Repeat __10__ times on each side.     Kneeling    Kneel, sitting on heels, arms forward on floor. Push chest toward floor, reaching forward as far as possible. Hold _30__ seconds. Repeat _5__ times per session.       Low Back Stretch: One leg (Supine)    Lying on back, bring one knee toward chest by pulling gently behind knee. Hold __30Hip Abduction: Modified    Lying on right side with pillow between thighs, raise top leg from pillow, keep toes pointing forward Repeat __10__ times per set. Do __2__ sets per session.   http://orth.exer.us/704   Copyright  V[employer]. All rights reserved.  __ seconds. Repeat with other leg.     Bracing With Arm / Leg Raise (Quadruped)    On hands and knees find neutral spine. Tighten pelvic floor and abdominals and hold. Alternating, lift arm to shoulder level and opposite leg to hip level. Repeat _10__ times on each side.     Squat: Double Leg  (Supported)    With feet shoulder width apart, hold support. Squat, keeping lower leg vertical, knee in line with second toe. Use legs, do not pull up and down with arms. Do not go up on toes. Repeat _10__ times per set. Do _2__ sets per session.

## 2017-10-03 NOTE — Therapy (Addendum)
[facility] [address]  [place], [place], [address] Phone: [phone]   Fax:  [phone]  Physical Therapy Evaluation  Patient Details  Name: Mark Friedman. MRN: [id number] Date of Birth: Mar 31, 1971 Referring Provider: Dr. Karlton Friedman   Encounter Date: 10/03/2017  PT End of Session - 10/03/17 1626    Visit Number  1    Number of Visits  4    Date for PT Re-Evaluation  10/31/17    PT Start Time  1448    PT Stop Time  1533    PT Time Calculation (min)  45 min    Activity Tolerance  Patient tolerated treatment well    Behavior During Therapy  [facility] for tasks assessed/performed       Past Medical History:  Diagnosis Date  . Asthma   . Chronic low back pain   . Environmental allergies     Past Surgical History:  Procedure Laterality Date  . NO PAST SURGERIES      There were no vitals filed for this visit.   Subjective Assessment - 10/03/17 1451    Subjective  Patient reporting "hurting back." Feels like it may be due to losing weight quickly and not used to using muscles. Noticed pain on 08/09/17 after mowing lawn. Went to ED. Did a trial of prednisone with minimal relief. Did have injection with some relief. Does report N&T into L leg. About a week ago had some mid back pain (R side) - now "off and on". Currently wearing brace - wears with working. Numbness is along anterior region of L LE.     Patient is accompained by:  Family member wife    Diagnostic tests  MRI: 1. L3-4 left paracentral superiorly migrating disc extrusion with left L3 and L4 impingement. 2. L3-4 right foraminal and extraforaminal protrusion with right L3 impingement. Moderate facet and ligament degenerative hypertrophy. Spinal stenosis is moderate. 3. L2-3 disc narrowing and facet hypertrophy causes mild to moderate spinal stenosis. Right extraforaminal disc protrusion which contacts the L2 root.    Patient Stated Goals  improve pain and  numbness    Currently in Pain?  Yes    Pain Score  2     Pain Location  Back    Pain Orientation  Right;Left;Lower    Pain Descriptors / Indicators  Aching;Discomfort    Pain Type  Acute pain    Pain Onset  More than a month ago    Pain Frequency  Intermittent    Aggravating Factors   forward bending    Pain Relieving Factors  hot shower         [facility] PT Assessment - 10/03/17 0001      Assessment   Medical Diagnosis  Low Back Pain radiating to the L leg    Referring Provider  Dr. Karlton Friedman    Onset Date/Surgical Date  08/09/17    Next MD Visit  10/21/17    Prior Therapy  no      Precautions   Precautions  None      Restrictions   Weight Bearing Restrictions  No      Balance Screen   Has the patient fallen in the past 6 months  No    Has the patient had a decrease in activity level because of a fear of falling?   No    Is the patient reluctant to leave their home because of a fear of falling?  No      Home Film/video editor residence      Prior Function   Level of Independence  Independent    Vocation  Full time employment    Vocation Requirements  [employer]      Cognition   Overall Cognitive Status  Within Functional Limits for tasks assessed      Sensation   Light Touch  -- patient reports L LE numbness      Coordination   Gross Motor Movements are Fluid and Coordinated  Yes      Posture/Postural Control   Posture/Postural Control  Postural limitations    Postural Limitations  Rounded Shoulders;Forward head      ROM / Strength   AROM / PROM / Strength  AROM      AROM   AROM Assessment Site  Lumbar    Lumbar Flexion  fingertip to toes - stretch in back of leg, minor back pain    Lumbar Extension  WFL - minor discomfort    Lumbar - Right Side Bend  fingertip to joint line - minor discomfort    Lumbar - Left Side Bend  fingertip to joint line - stretching    Lumbar - Right Rotation  WNL - no pain    Lumbar - Left Rotation  WNL -  minor pain      Flexibility   Soft Tissue Assessment /Muscle Length  yes    Hamstrings  minimal tightness      Palpation   Spinal mobility  good segmental mobility without pain provocation    Palpation comment  slight tenderness to B thoracic paraspinals             Objective measurements completed on examination: See above findings.      [facility] Adult PT Treatment/Exercise - 10/03/17 0001      Exercises   Exercises  Lumbar      Lumbar Exercises: Stretches   Single Knee to Chest Stretch  2 reps;20 seconds    Double Knee to Chest Stretch  1 rep;30 seconds    Lower Trunk Rotation  5 reps;10 seconds    Prone on Elbows Stretch  3 reps;20 seconds    Press Ups  3 reps;10 seconds      Lumbar Exercises: Standing   Functional Squats  10 reps;5 seconds      Lumbar Exercises: Supine   Bridge  10 reps;3 seconds    Other Supine Lumbar Exercises  single leg bridge 5 x 3 sec each side      Lumbar Exercises: Sidelying   Hip Abduction  10 reps;2 seconds    Hip Abduction Limitations  L LE only      Lumbar Exercises: Quadruped   Madcat/Old Horse  5 reps    Other Quadruped Lumbar Exercises  childs pose 1 x 30 seconds             PT Education - 10/03/17 1626    Education provided  Yes    Education Details  exam findings, POC, HEP    Person(s) Educated  Patient;Spouse    Methods  Explanation;Demonstration;Handout    Comprehension  Verbalized understanding;Returned demonstration;Need further instruction          PT Long Term Goals - 10/03/17 1711      PT LONG TERM GOAL #1   Title  patient to be independent with advanced HEP    Status  New    Target Date  10/31/17  PT LONG TERM GOAL #2   Title  patient to report pain and radicular symptom reduction by >/= 50%    Status  New    Target Date  10/31/17      PT LONG TERM GOAL #3   Title  patient to report ability to perform work and leisure activities without pain provocation    Status  New    Target Date   10/31/17             Plan - 10/03/17 1635    Clinical Impression Statement  Mr. Mark Friedman is a 46 y/o male presenting to OPPT today regarding primary complaints of low back pain with numbeness of L LE. Patient with no known mechanism of injury, but does report pain onset after mowing yard. Patient today with good lumbar AROM with very minor pain. Difficult to glean information from patient during visit with multiple VC and attempts to gain information regarding pain patterns. Patient and wife expressing difficulty returning to PT greater than todays visit due to financial constraints. HEP given today for gentle stretching and strengthening with some difficulty on carryover, as pateint reports he is tired and had some difficulty following directions. Patient would benefit from conitnued PT services to address pain and numbness to allow for improved functional mobility. Will plan to leave chart open for 30 days in the event patient needs to return.    Clinical Presentation  Stable    Clinical Decision Making  Low    Rehab Potential  Good    PT Frequency  1x / week    PT Duration  4 weeks    PT Treatment/Interventions  ADLs/Self Care Home Management;Cryotherapy;Electrical Stimulation;Moist Heat;Traction;Therapeutic activities;Therapeutic exercise;Functional mobility training;Neuromuscular re-education;Patient/family education;Manual techniques;Taping;Dry needling    Consulted and Agree with Plan of Care  Patient       Patient will benefit from skilled therapeutic intervention in order to improve the following deficits and impairments:  Decreased activity tolerance, Decreased mobility, Pain  Visit Diagnosis: Acute left-sided low back pain with left-sided sciatica - Plan: PT plan of care cert/re-cert  Abnormal posture - Plan: PT plan of care cert/re-cert  Other symptoms and signs involving the musculoskeletal system - Plan: PT plan of care cert/re-cert     Problem List Patient Active  Problem List   Diagnosis Date Noted  . Environmental allergies 09/16/2017  . Low back pain radiating to left leg 08/15/2017  . BMI 40.0-44.9, adult ([place]) 06/24/2012  . Asthma 06/24/2012      Lanney Gins, PT, DPT 10/03/17 5:16 PM   PHYSICAL THERAPY DISCHARGE SUMMARY  Visits from Start of Care: 1  Current functional level related to goals / functional outcomes: See above   Remaining deficits: See above; did not return to PT due to financial constraints   Education / Equipment: HEP  Plan: Patient agrees to discharge.  Patient goals were met. Patient is being discharged due to financial reasons.  ?????     Lanney Gins, PT, DPT 11/06/17 4:01 PM    [facility] [address]  [place], [place], [address] Phone: [phone]   Fax:  [phone]  Name: Mark Friedman. MRN: [id number] Date of Birth: Mar 26, 1971

## 2017-10-21 ENCOUNTER — Ambulatory Visit: Payer: BLUE CROSS/BLUE SHIELD | Admitting: Family Medicine

## 2017-10-21 ENCOUNTER — Ambulatory Visit (INDEPENDENT_AMBULATORY_CARE_PROVIDER_SITE_OTHER): Payer: BLUE CROSS/BLUE SHIELD | Admitting: Family Medicine

## 2017-10-21 ENCOUNTER — Encounter: Payer: Self-pay | Admitting: Family Medicine

## 2017-10-21 VITALS — BP 118/84 | HR 72 | Temp 98.0°F | Ht 70.0 in | Wt 233.1 lb

## 2017-10-21 DIAGNOSIS — M545 Low back pain, unspecified: Secondary | ICD-10-CM

## 2017-10-21 DIAGNOSIS — Z87898 Personal history of other specified conditions: Secondary | ICD-10-CM | POA: Diagnosis not present

## 2017-10-21 DIAGNOSIS — M79605 Pain in left leg: Secondary | ICD-10-CM

## 2017-10-21 DIAGNOSIS — Z0000 Encounter for general adult medical examination without abnormal findings: Secondary | ICD-10-CM

## 2017-10-21 DIAGNOSIS — Z9109 Other allergy status, other than to drugs and biological substances: Secondary | ICD-10-CM | POA: Diagnosis not present

## 2017-10-21 DIAGNOSIS — Z114 Encounter for screening for human immunodeficiency virus [HIV]: Secondary | ICD-10-CM

## 2017-10-21 LAB — CBC
HEMATOCRIT: 42.2 % (ref 39.0–52.0)
HEMOGLOBIN: 14.3 g/dL (ref 13.0–17.0)
MCHC: 33.8 g/dL (ref 30.0–36.0)
MCV: 93.6 fl (ref 78.0–100.0)
Platelets: 195 10*3/uL (ref 150.0–400.0)
RBC: 4.51 Mil/uL (ref 4.22–5.81)
RDW: 13.7 % (ref 11.5–15.5)
WBC: 6.1 10*3/uL (ref 4.0–10.5)

## 2017-10-21 LAB — HEMOGLOBIN A1C: Hgb A1c MFr Bld: 5.4 % (ref 4.6–6.5)

## 2017-10-21 NOTE — Assessment & Plan Note (Signed)
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  Pin improved with injection.  His strength and reflexes are back to normal on testing today though he reports not feeling 100% re: strength.  He will continue with home exercises for 6 more weeks.  Tylenol, ibuprofen only if needed.

## 2017-10-21 NOTE — Progress Notes (Signed)
PCP: Patient, No Pcp Per  Subjective:   HPI: Patient is a 46 y.o. male here for low back pain.  9/24: Patient denies known injury or trauma. He reports on Friday afternoon he started to get pain in low back more on the left side with radiation down left leg to the ankle. Pain level 10/10 and sharp. Associated numbness down past the knee. Noticed also when using his lawnmower. Has been taking extra strength tylenol. Given two shots in the ED. No bowel/bladder dysfunction. No skin changes.  10/1: Patient reports he feels his low back is better but leg still bothering him. Prednisone helped. Rarely taking ibuprofen. Feels left leg down to knee is numb with stiffness and soreness. Gets a twitch in back at times on left side. Left leg feels weak. No bowel/bladder dysfunction.  10/30: Patient reports overall he feels better. Pain has essentially resolved following injection though occasionally will feel pain there if he's 'not paying attention' or moves wrong. Feels strength is improving in left leg, not giving out though still feels weak. Has numbness over anterior knee. No skin changes.  12/3: Patient reports he's doing well. Did a visit of PT and reports he's doing home exercises regularly. Some discomfort in low back at times but overall improved. Legs feel stronger but not back to normal. Pain level 0/10 currently. No skin changes, numbness.  Past Medical History:  Diagnosis Date  . Asthma   . Chronic low back pain   . Environmental allergies     Current Outpatient Medications on File Prior to Visit  Medication Sig Dispense Refill  . albuterol (PROVENTIL HFA;VENTOLIN HFA) 108 (90 Base) MCG/ACT inhaler Inhale 2 puffs into the lungs every 6 (six) hours as needed for wheezing. 1 Inhaler 2  . ascorbic acid (VITAMIN C) 250 MG CHEW Chew 250 mg by mouth daily.    Marland Kitchen. ibuprofen (ADVIL,MOTRIN) 600 MG tablet Take 1 tablet (600 mg total) by mouth every 6 (six) hours as needed.  30 tablet 0   No current facility-administered medications on file prior to visit.     Past Surgical History:  Procedure Laterality Date  . NO PAST SURGERIES      No Known Allergies  Social History   Socioeconomic History  . Marital status: Married    Spouse name: Not on file  . Number of children: Not on file  . Years of education: Not on file  . Highest education level: Not on file  Social Needs  . Financial resource strain: Not on file  . Food insecurity - worry: Not on file  . Food insecurity - inability: Not on file  . Transportation needs - medical: Not on file  . Transportation needs - non-medical: Not on file  Occupational History  . Not on file  Tobacco Use  . Smoking status: Never Smoker  . Smokeless tobacco: Never Used  Substance and Sexual Activity  . Alcohol use: No  . Drug use: No  . Sexual activity: Yes    Partners: Female  Other Topics Concern  . Not on file  Social History Narrative  . Not on file    Family History  Problem Relation Age of Onset  . Cancer Neg Hx     BP 121/77   Pulse 71   Ht 5\' 10"  (1.778 m)   Wt 230 lb (104.3 kg)   BMI 33.00 kg/m   Review of Systems: See HPI above.     Objective:  Physical Exam:  Gen: NAD,  comfortable in exam room.  Back: No gross deformity, scoliosis. No TTP .  No midline or bony TTP. FROM. Strength LEs 5/5 all muscle groups.   2+ MSRs in patellar and achilles tendons, equal bilaterally. Negative SLRs. Sensation intact to light touch bilaterally. Negative logroll bilateral hips Negative fabers and piriformis stretches.   Assessment & Plan:  1. 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  Pin improved with injection.  His strength and reflexes are back to normal on testing today though he reports not feeling 100% re: strength.  He will continue with home exercises for 6 more weeks.  Tylenol, ibuprofen only if needed.  He also reported  having problems with plantar pain in right foot, on heel medially.  He would like to return for custom orthotics.  We also reviewed plantar fascia instructions/treatments.

## 2017-10-21 NOTE — Patient Instructions (Addendum)
Give us 2-3 days to get results of your labs back.    Let us know if you need anything.

## 2017-10-21 NOTE — Progress Notes (Signed)
Chief Complaint  Patient presents with  . Annual Exam    Well Male Mark MareLewis Renfrow Jr. is here for a complete physical.   His last physical was >1 year ago.  Current diet: in general, a "healthy" diet, could be better.   Current exercise: PT exercises Weight trend: stable Does pt snore? No. Daytime fatigue? No. Seat belt? Yes.    Health maintenance Tetanus- Yes HIV- No Prostate cancer screening- No  Past Medical History:  Diagnosis Date  . Asthma   . Chronic low back pain   . Environmental allergies     Past Surgical History:  Procedure Laterality Date  . NO PAST SURGERIES     Medications  Current Outpatient Medications on File Prior to Visit  Medication Sig Dispense Refill  . albuterol (PROVENTIL HFA;VENTOLIN HFA) 108 (90 Base) MCG/ACT inhaler Inhale 2 puffs into the lungs every 6 (six) hours as needed for wheezing. 1 Inhaler 2  . ascorbic acid (VITAMIN C) 250 MG CHEW Chew 250 mg by mouth daily.    Marland Kitchen. ibuprofen (ADVIL,MOTRIN) 600 MG tablet Take 1 tablet (600 mg total) by mouth every 6 (six) hours as needed. 30 tablet 0  . predniSONE (DELTASONE) 10 MG tablet 6 tabs po day 1, 5 tabs po day 2, 4 tabs po day 3, 3 tabs po day 4, 2 tabs po day 5, 1 tab po day 6 21 tablet 0   Allergies No Known Allergies   Family History Family History  Problem Relation Age of Onset  . Cancer Neg Hx     Review of Systems: Constitutional: no fevers or chills Eye:  no recent significant change in vision Ear/Nose/Mouth/Throat:  Ears: +wax build up (intermittently) Nose/Mouth/Throat:  no complaints of nasal congestion or bleeding, no sore throat Cardiovascular:  no chest pain, no palpitations Respiratory:  no cough and no shortness of breath Gastrointestinal:  no abdominal pain, no change in bowel habits, no nausea, vomiting, diarrhea, or constipation and no black or bloody stool GU:  Male: negative for dysuria, frequency, and incontinence and negative for prostate  symptoms Musculoskeletal/Extremities:  +R foot pain (Dx of plantar fasciitis being followed by Dr. Pearletha ForgeHudnall) Integumentary (Skin/Breast):  no abnormal skin lesions reported Neurologic:  no headache Endocrine: No unexpected weight changes Hematologic/Lymphatic:  no night sweats  Exam BP 118/84 (BP Location: Left Arm, Patient Position: Sitting, Cuff Size: Large)   Pulse 72   Temp 98 F (36.7 C) (Oral)   Ht 5\' 10"  (1.778 m)   Wt 233 lb 2 oz (105.7 kg)   SpO2 95%   BMI 33.45 kg/m  General:  well developed, well nourished, in no apparent distress Skin:  no significant moles, warts, or growths Head:  no masses, lesions, or tenderness Eyes:  pupils equal and round, sclera anicteric without injection Ears:  canals without lesions, TMs shiny without retraction, no obvious effusion, no erythema Nose:  nares patent, septum midline, mucosa normal Throat/Pharynx:  lips and gingiva without lesion; tongue and uvula midline; non-inflamed pharynx; no exudates or postnasal drainage Neck: neck supple without adenopathy, thyromegaly, or masses Lungs:  clear to auscultation, breath sounds equal bilaterally, no respiratory distress Cardio:  regular rate and rhythm without murmurs, heart sounds without clicks or rubs Abdomen:  abdomen soft, nontender; bowel sounds normal; no masses or organomegaly Genital (male): circumcised penis, no lesions or discharge; testes present bilaterally without masses or tenderness Rectal: Deferred Musculoskeletal:  symmetrical muscle groups noted without atrophy or deformity Extremities:  no clubbing, cyanosis, or edema,  no deformities, no skin discoloration Neuro:  gait normal; deep tendon reflexes normal and symmetric Psych: well oriented with normal range of affect and appropriate judgment/insight  Assessment and Plan  Well adult exam - Plan: CBC, Comprehensive metabolic panel, Lipid panel  Encounter for screening for HIV - Plan: HIV antibody  Environmental  allergies - Plan: Allergy Panel 11, Mold Group, Allergy Panel, Animal Group, Allergy Panel, Region 2, Grasses-([facility]), Allergy Panel 18, Nut Mix Group  History of prediabetes - Plan: Hemoglobin A1c   Well 46 y.o. male. Counseled on diet and exercise. Counseled on risks and benefits of prostate cancer screening.  He agrees to forego testing for now. Other orders as above.  Follow-up on allergy testing as discussed at his initial visit. Follow up in 1 year pending the above workup. The patient voiced understanding and agreement to the plan.  Jilda Rocheicholas Paul Rose BudWendling, DO 10/21/17 11:45 AM

## 2017-10-21 NOTE — Patient Instructions (Signed)
Continue your home exercises for your back for 6 more weeks.  You have plantar fasciitis of your right foot. Take tylenol and/or aleve as needed for pain  Plantar fascia stretch for 20-30 seconds (do 3 of these) in morning Lowering/raise on a step exercises 3 x 10 once or twice a day - this is very important for long term recovery. Can add heel walks, toe walks forward and backward as well Ice heel for 15 minutes as needed. Avoid flat shoes/barefoot walking as much as possible. Arch straps have been shown to help with pain. Inserts are important (dr. Jari Sportsmanscholls active series, spencos, our green insoles, custom orthotics). If you want me to make you custom orthotics, tell [place] as you leave today and we will block out 45 minutes to do this - bring the shoes you want to wear these in the most. Steroid injection is a consideration for short term pain relief if you are struggling. Physical therapy is also an option. Follow up with me in 6 weeks or as needed for the foot.

## 2017-10-21 NOTE — Progress Notes (Signed)
Pre visit review using our clinic review tool, if applicable. No additional management support is needed unless otherwise documented below in the visit note. 

## 2017-10-22 LAB — ALLERGY PANEL, ANIMAL GROUP
ALLERGEN, HORSE DANDER,E3: 0.34 kU/L — AB
ALLERGEN, MOUSE U PROTEIN, E72: 1.36 kU/L — AB
Allergen,Goose feathers, e70: <0.1 kU/L
CLASS: 0
CLASS: 0
CLASS: 0
CLASS: 0
Class: 2
Cow Dander IgE: 0.22 kU/L — ABNORMAL HIGH

## 2017-10-22 LAB — INTERPRETATION:

## 2017-10-22 LAB — ALLERGY PANEL 11, MOLD GROUP
ASPERGILLUS FUMIGATUS M3: 0.57 kU/L — AB
Allergen, A. alternata, m6: <0.1 kU/L
Allergen, Mucor Racemosus, M4: 0.14 kU/L — ABNORMAL HIGH
CANDIDA ALBICANS: 0.59 kU/L — AB
CLADOSPORIUM HERBARUM (M2) IGE: 0.12 kU/L — ABNORMAL HIGH
CLASS: 0
CLASS: 1
Class: 0
Class: 0
Class: 1

## 2017-10-22 LAB — COMPREHENSIVE METABOLIC PANEL
ALK PHOS: 57 U/L (ref 39–117)
ALT: 19 U/L (ref 0–53)
AST: 22 U/L (ref 0–37)
Albumin: 4.5 g/dL (ref 3.5–5.2)
BUN: 17 mg/dL (ref 6–23)
CHLORIDE: 102 meq/L (ref 96–112)
CO2: 28 meq/L (ref 19–32)
Calcium: 9.4 mg/dL (ref 8.4–10.5)
Creatinine, Ser: 0.67 mg/dL (ref 0.40–1.50)
GFR: 136.84 mL/min (ref >60.00)
GLUCOSE: 88 mg/dL (ref 70–99)
POTASSIUM: 4.1 meq/L (ref 3.5–5.1)
SODIUM: 139 meq/L (ref 135–145)
Total Bilirubin: 0.8 mg/dL (ref 0.2–1.2)
Total Protein: 7.3 g/dL (ref 6.0–8.3)

## 2017-10-22 LAB — LIPID PANEL
CHOL/HDL RATIO: 4
Cholesterol: 219 mg/dL — ABNORMAL HIGH (ref 0–200)
HDL: 58.6 mg/dL (ref >39.00)
LDL CALC: 147 mg/dL — AB (ref 0–99)
NONHDL: 160.71
Triglycerides: 70 mg/dL (ref 0.0–149.0)
VLDL: 14 mg/dL (ref 0.0–40.0)

## 2017-10-22 LAB — ALLERGY PANEL 18, NUT MIX GROUP
CLASS: 0
CLASS: 0
CLASS: 0
CLASS: 0
CLASS: 0
CLASS: 0
CLASS: 0
Cashew IgE: <0.1 kU/L
Hazelnut: <0.1 kU/L
Pecan Nut: <0.1 kU/L
Sesame Seed f10: <0.1 kU/L

## 2017-10-22 LAB — ALLERGY PANEL, REGION 2, GRASSES
CLASS: 0
CLASS: 0
CLASS: 0
CLASS: 0
Class: 0
G005 Rye, Perennial: 0.13 kU/L — ABNORMAL HIGH
G009 Red Top: 0.2 kU/L — ABNORMAL HIGH
ORCHARD GRASS (COCKSFOOT) (G3) IGE: <0.1 kU/L

## 2017-10-22 LAB — HIV ANTIBODY (ROUTINE TESTING W REFLEX): HIV: NONREACTIVE

## 2017-10-23 ENCOUNTER — Ambulatory Visit: Payer: BLUE CROSS/BLUE SHIELD | Admitting: Family Medicine

## 2017-10-23 ENCOUNTER — Encounter: Payer: Self-pay | Admitting: Family Medicine

## 2017-10-23 DIAGNOSIS — M722 Plantar fascial fibromatosis: Secondary | ICD-10-CM

## 2017-10-24 ENCOUNTER — Encounter: Payer: Self-pay | Admitting: Family Medicine

## 2017-10-24 DIAGNOSIS — M722 Plantar fascial fibromatosis: Secondary | ICD-10-CM | POA: Insufficient documentation

## 2017-10-24 NOTE — Assessment & Plan Note (Signed)
2/2 plantar fasciitis.  Custom orthotics made today.  Reviewed home exercises and stretches.  Patient was fitted for a : standard, cushioned, semi-rigid orthotic. The orthotic was heated and afterward the patient stood on the orthotic blank positioned on the orthotic stand. The patient was positioned in subtalar neutral position and 10 degrees of ankle dorsiflexion in a weight bearing stance. After completion of molding, a stable base was applied to the orthotic blank. The blank was ground to a stable position for weight bearing. Size: 16 blue swirl Base: blue med density EVA Posting: none Additional orthotic padding: none Total prep time 40 minutes - > 50% of which spent on counseling, instructions re: plantar fasciitis and how to break in orthotics.

## 2017-10-24 NOTE — Progress Notes (Signed)
PCP: Patient, No Pcp Per  Subjective:   HPI: Patient is a 46 y.o. male here for custom orthotics.  Patient returned today for custom orthotics given problems with plantar fasciitis right foot.  Past Medical History:  Diagnosis Date  . Asthma   . Chronic low back pain   . Environmental allergies     Current Outpatient Medications on File Prior to Visit  Medication Sig Dispense Refill  . albuterol (PROVENTIL HFA;VENTOLIN HFA) 108 (90 Base) MCG/ACT inhaler Inhale 2 puffs into the lungs every 6 (six) hours as needed for wheezing. 1 Inhaler 2  . ascorbic acid (VITAMIN C) 250 MG CHEW Chew 250 mg by mouth daily.    Marland Kitchen. ibuprofen (ADVIL,MOTRIN) 600 MG tablet Take 1 tablet (600 mg total) by mouth every 6 (six) hours as needed. 30 tablet 0   No current facility-administered medications on file prior to visit.     Past Surgical History:  Procedure Laterality Date  . NO PAST SURGERIES      No Known Allergies  Social History   Socioeconomic History  . Marital status: Married    Spouse name: Not on file  . Number of children: Not on file  . Years of education: Not on file  . Highest education level: Not on file  Social Needs  . Financial resource strain: Not on file  . Food insecurity - worry: Not on file  . Food insecurity - inability: Not on file  . Transportation needs - medical: Not on file  . Transportation needs - non-medical: Not on file  Occupational History  . Not on file  Tobacco Use  . Smoking status: Never Smoker  . Smokeless tobacco: Never Used  Substance and Sexual Activity  . Alcohol use: No  . Drug use: No  . Sexual activity: Yes    Partners: Female  Other Topics Concern  . Not on file  Social History Narrative  . Not on file    Family History  Problem Relation Age of Onset  . Cancer Neg Hx     BP 122/85   Pulse 73   Ht 5\' 10"  (1.778 m)   Wt 233 lb (105.7 kg)   BMI 33.43 kg/m   Review of Systems: See HPI above.     Objective:  Physical  Exam:  Gen: NAD, comfortable in exam room  Right foot/ankle: Pes planus. Mild hallux rigidus. No other deformity, swelling, ecchymoses FROM ankle. TTP plantar foot at medial insertion of plantar fascia on calcaneus. Negative calcaneal squeeze.  Left foot/ankle: Pes planus. Mild hallux rigidus. No other deformity, swelling, ecchymoses FROM ankle. No TTP.   Assessment & Plan:  1. Right foot pain - 2/2 plantar fasciitis.  Custom orthotics made today.  Reviewed home exercises and stretches.  Patient was fitted for a : standard, cushioned, semi-rigid orthotic. The orthotic was heated and afterward the patient stood on the orthotic blank positioned on the orthotic stand. The patient was positioned in subtalar neutral position and 10 degrees of ankle dorsiflexion in a weight bearing stance. After completion of molding, a stable base was applied to the orthotic blank. The blank was ground to a stable position for weight bearing. Size: 16 blue swirl Base: blue med density EVA Posting: none Additional orthotic padding: none Total prep time 40 minutes - > 50% of which spent on counseling, instructions re: plantar fasciitis and how to break in orthotics.

## 2018-07-11 ENCOUNTER — Encounter: Payer: Self-pay | Admitting: Family Medicine

## 2018-07-11 ENCOUNTER — Ambulatory Visit: Payer: BLUE CROSS/BLUE SHIELD | Admitting: Family Medicine

## 2018-07-11 DIAGNOSIS — M25561 Pain in right knee: Secondary | ICD-10-CM

## 2018-07-11 MED ORDER — METHYLPREDNISOLONE ACETATE 40 MG/ML IJ SUSP
40.0000 mg | Freq: Once | INTRAMUSCULAR | Status: AC
  Administered 2018-07-11: 40 mg via INTRA_ARTICULAR

## 2018-07-11 NOTE — Patient Instructions (Signed)
Your pain is due to knee effusion and synovitis with underlying arthritis. These are the different medications you can take for this: Tylenol 500mg  1-2 tabs three times a day for pain. Capsaicin, aspercreme, or biofreeze topically up to four times a day may also help with pain. Some supplements that may help: Boswellia extract, curcumin, pycnogenol Aleve 1-2 tabs twice a day with food Cortisone injections are an option after draining your knee. It's important that you continue to stay active. Straight leg raises, knee extensions 3 sets of 10 once a day (add ankle weight if these become too easy). Consider physical therapy to strengthen muscles around the joint that hurts to take pressure off of the joint itself. Ice 15 minutes at a time 3-4 times a day as needed to help with pain. Water aerobics and cycling with low resistance are the best two types of exercise for arthritis though any exercise is ok as long as it doesn't worsen the pain. Follow up with me in 1 month.

## 2018-07-14 ENCOUNTER — Encounter: Payer: Self-pay | Admitting: Family Medicine

## 2018-07-14 NOTE — Progress Notes (Signed)
PCP: Sharlene DoryWendling, Nicholas Paul, DO  Subjective:   HPI: Patient is a 47 y.o. male here for right knee pain.  Patient reports that he has had pain and swelling in his right knee for several months. States is difficult to put a number on the level of pain though it can be sharp, anterior, worse with ambulation. Pain is worse with climbing and bending as at work. He has tried icing and Epsom salt soaks with mild benefit. No skin changes or numbness.  Past Medical History:  Diagnosis Date  . Asthma   . Chronic low back pain   . Environmental allergies     Current Outpatient Medications on File Prior to Visit  Medication Sig Dispense Refill  . albuterol (PROVENTIL HFA;VENTOLIN HFA) 108 (90 Base) MCG/ACT inhaler Inhale 2 puffs into the lungs every 6 (six) hours as needed for wheezing. 1 Inhaler 2  . ascorbic acid (VITAMIN C) 250 MG CHEW Chew 250 mg by mouth daily.    Marland Kitchen. ibuprofen (ADVIL,MOTRIN) 600 MG tablet Take 1 tablet (600 mg total) by mouth every 6 (six) hours as needed. 30 tablet 0   No current facility-administered medications on file prior to visit.     Past Surgical History:  Procedure Laterality Date  . NO PAST SURGERIES      No Known Allergies  Social History   Socioeconomic History  . Marital status: Married    Spouse name: Not on file  . Number of children: Not on file  . Years of education: Not on file  . Highest education level: Not on file  Occupational History  . Not on file  Social Needs  . Financial resource strain: Not on file  . Food insecurity:    Worry: Not on file    Inability: Not on file  . Transportation needs:    Medical: Not on file    Non-medical: Not on file  Tobacco Use  . Smoking status: Never Smoker  . Smokeless tobacco: Never Used  Substance and Sexual Activity  . Alcohol use: No  . Drug use: No  . Sexual activity: Yes    Partners: Female  Lifestyle  . Physical activity:    Days per week: Not on file    Minutes per session: Not  on file  . Stress: Not on file  Relationships  . Social connections:    Talks on phone: Not on file    Gets together: Not on file    Attends religious service: Not on file    Active member of club or organization: Not on file    Attends meetings of clubs or organizations: Not on file    Relationship status: Not on file  . Intimate partner violence:    Fear of current or ex partner: Not on file    Emotionally abused: Not on file    Physically abused: Not on file    Forced sexual activity: Not on file  Other Topics Concern  . Not on file  Social History Narrative  . Not on file    Family History  Problem Relation Age of Onset  . Cancer Neg Hx     BP 140/83   Pulse 71   Ht 5\' 10"  (1.778 m)   Wt 225 lb (102.1 kg)   BMI 32.28 kg/m   Review of Systems: See HPI above.     Objective:  Physical Exam:  Gen: NAD, comfortable in exam room  Right knee: Mod effusion.  No other gross deformity,  ecchymoses. Mild TTP medial, lateral joint lines. FROM with 5/5 strength, pain on full flexion. Negative ant/post drawers. Negative valgus/varus testing. Negative lachmanns. Negative mcmurrays, apleys, patellar apprehension. NV intact distally.  Left knee: No deformity. FROM with 5/5 strength. No tenderness to palpation. NVI distally.   Assessment & Plan:  1. Right knee pain - 2/2 effusion and synovitis confirmed with ultrasound, with underlying mild arthropathy.  Discussed tylenol, topical medications, supplements that may help, aleve.  Aspiration and injection performed.  Shown home exercises to do daily.  Icing, compression.  F/u in 1 month.  After informed written consent timeout was performed, patient was lying supine on exam table.  Right knee was prepped with alcohol swab.  Utilizing superolateral approach, 3 mL of bupivicaine was used for local anesthesia.  Then using an 18g needle on 60cc syringe,33 mL of clear straw-colored fluid was aspirated from right knee.  Knee was  then injected with 3:1 bupivicaine:depomedrol.  Patient tolerated procedure well without immediate complications

## 2018-08-06 ENCOUNTER — Encounter: Payer: Self-pay | Admitting: Family Medicine

## 2018-08-06 ENCOUNTER — Ambulatory Visit: Payer: BLUE CROSS/BLUE SHIELD | Admitting: Family Medicine

## 2018-08-06 VITALS — BP 118/73 | HR 60 | Ht 70.0 in | Wt 225.0 lb

## 2018-08-06 DIAGNOSIS — M25561 Pain in right knee: Secondary | ICD-10-CM | POA: Diagnosis not present

## 2018-08-06 DIAGNOSIS — M545 Low back pain, unspecified: Secondary | ICD-10-CM

## 2018-08-06 NOTE — Patient Instructions (Signed)
Do the back exercises daily. Wear the knee sleeves from [employer] (order full knee sleeves online) when at work or if you're going to be up and walking around a lot. Follow up with me as needed.

## 2018-08-09 ENCOUNTER — Encounter: Payer: Self-pay | Admitting: Family Medicine

## 2018-08-09 NOTE — Progress Notes (Signed)
PCP: Sharlene Dory, DO  Subjective:   HPI: Patient is a 47 y.o. male here for right knee pain, low back pain.  8/23: Patient reports that he has had pain and swelling in his right knee for several months. States is difficult to put a number on the level of pain though it can be sharp, anterior, worse with ambulation. Pain is worse with climbing and bending as at work. He has tried icing and Epsom salt soaks with mild benefit. No skin changes or numbness.  9/18: Patient reports he is doing much better. Pain level currently 0 out of 10 but can have some pain when he is on his feet a lot when at work, dull anterior pain. He also notices some pain when he is doing a lot of yard work. He has been wearing his compression sleeve on his right knee. He is also reporting some increased low back pain while at work. Has not been doing specific exercises or stretches. No bowel or bladder dysfunction. No numbness. No radiation of pain.  Past Medical History:  Diagnosis Date  . Asthma   . Chronic low back pain   . Environmental allergies     Current Outpatient Medications on File Prior to Visit  Medication Sig Dispense Refill  . albuterol (PROVENTIL HFA;VENTOLIN HFA) 108 (90 Base) MCG/ACT inhaler Inhale 2 puffs into the lungs every 6 (six) hours as needed for wheezing. 1 Inhaler 2  . ascorbic acid (VITAMIN C) 250 MG CHEW Chew 250 mg by mouth daily.    Marland Kitchen ibuprofen (ADVIL,MOTRIN) 600 MG tablet Take 1 tablet (600 mg total) by mouth every 6 (six) hours as needed. 30 tablet 0   No current facility-administered medications on file prior to visit.     Past Surgical History:  Procedure Laterality Date  . NO PAST SURGERIES      No Known Allergies  Social History   Socioeconomic History  . Marital status: Married    Spouse name: Not on file  . Number of children: Not on file  . Years of education: Not on file  . Highest education level: Not on file  Occupational History  .  Not on file  Social Needs  . Financial resource strain: Not on file  . Food insecurity:    Worry: Not on file    Inability: Not on file  . Transportation needs:    Medical: Not on file    Non-medical: Not on file  Tobacco Use  . Smoking status: Never Smoker  . Smokeless tobacco: Never Used  Substance and Sexual Activity  . Alcohol use: No  . Drug use: No  . Sexual activity: Yes    Partners: Female  Lifestyle  . Physical activity:    Days per week: Not on file    Minutes per session: Not on file  . Stress: Not on file  Relationships  . Social connections:    Talks on phone: Not on file    Gets together: Not on file    Attends religious service: Not on file    Active member of club or organization: Not on file    Attends meetings of clubs or organizations: Not on file    Relationship status: Not on file  . Intimate partner violence:    Fear of current or ex partner: Not on file    Emotionally abused: Not on file    Physically abused: Not on file    Forced sexual activity: Not on file  Other Topics Concern  . Not on file  Social History Narrative  . Not on file    Family History  Problem Relation Age of Onset  . Cancer Neg Hx     BP 118/73   Pulse 60   Ht 5\' 10"  (1.778 m)   Wt 225 lb (102.1 kg)   BMI 32.28 kg/m   Review of Systems: See HPI above.     Objective:  Physical Exam:  Gen: NAD, comfortable in exam room  Right knee: Minimal effusion.  No other gross deformity, ecchymoses, swelling. No TTP. FROM with 5/5 strength flexion and extension. Negative ant/post drawers. Negative valgus/varus testing. Negative lachmans. Negative mcmurrays, apleys, patellar apprehension. NV intact distally.  Back: No gross deformity, scoliosis. TTP mildly paraspinal lumbar regions.  No midline or bony TTP. FROM with mild pain on flexion. Strength LEs 5/5 all muscle groups.   Negative SLRs. Sensation intact to light touch bilaterally. Negative logroll bilateral  hips   Assessment & Plan:  1. Right knee pain -secondary to effusion and synovitis with underlying mild arthropathy.  Doing much better following aspiration and injection of this knee.  Icing, knee sleeve when at work or when up and walking around a lot.  Take Tylenol, topical medications, Aleve as needed.  Follow-up PRN for this issue.  2. Low back pain -history and exam are reassuring.  He was given some home exercises and stretches to do daily.  Can consider physical therapy if not improving as expected.  Follow-up as needed otherwise.

## 2018-09-02 ENCOUNTER — Encounter: Payer: BLUE CROSS/BLUE SHIELD | Admitting: Family Medicine

## 2018-10-03 ENCOUNTER — Ambulatory Visit: Payer: BLUE CROSS/BLUE SHIELD | Admitting: Nurse Practitioner

## 2018-10-03 ENCOUNTER — Encounter: Payer: Self-pay | Admitting: Nurse Practitioner

## 2018-10-03 VITALS — BP 106/70 | HR 77 | Temp 97.7°F | Ht 70.0 in | Wt 227.0 lb

## 2018-10-03 DIAGNOSIS — R0981 Nasal congestion: Secondary | ICD-10-CM

## 2018-10-03 DIAGNOSIS — J060 Acute laryngopharyngitis: Secondary | ICD-10-CM

## 2018-10-03 LAB — POCT RAPID STREP A (OFFICE): RAPID STREP A SCREEN: NEGATIVE

## 2018-10-03 MED ORDER — CETIRIZINE-PSEUDOEPHEDRINE ER 5-120 MG PO TB12: 1.0000 | ORAL_TABLET | Freq: Two times a day (BID) | ORAL | 0 refills | Status: AC

## 2018-10-03 MED ORDER — DM-GUAIFENESIN ER 30-600 MG PO TB12: 1.0000 | ORAL_TABLET | Freq: Two times a day (BID) | ORAL | 0 refills | Status: DC | PRN

## 2018-10-03 NOTE — Patient Instructions (Addendum)
Maintain adequate oral hydration.  Sore Throat When you have a sore throat, your throat may:  Hurt.  Burn.  Feel irritated.  Feel scratchy.  Many things can cause a sore throat, including:  An infection.  Allergies.  Dryness in the air.  Smoke or pollution.  Gastroesophageal reflux disease (GERD).  A tumor.  A sore throat can be the first sign of another sickness. It can happen with other problems, like coughing or a fever. Most sore throats go away without treatment. Follow these instructions at home:  Take over-the-counter medicines only as told by your doctor.  Drink enough fluids to keep your pee (urine) clear or pale yellow.  Rest when you feel you need to.  To help with pain, try: ? Sipping warm liquids, such as broth, herbal tea, or warm water. ? Eating or drinking cold or frozen liquids, such as frozen ice pops. ? Gargling with a salt-water mixture 3-4 times a day or as needed. To make a salt-water mixture, add -1 tsp of salt in 1 cup of warm water. Mix it until you cannot see the salt anymore. ? Sucking on hard candy or throat lozenges. ? Putting a cool-mist humidifier in your bedroom at night. ? Sitting in the bathroom with the door closed for 5-10 minutes while you run hot water in the shower.  Do not use any tobacco products, such as cigarettes, chewing tobacco, and e-cigarettes. If you need help quitting, ask your doctor. Contact a doctor if:  You have a fever for more than 2-3 days.  You keep having symptoms for more than 2-3 days.  Your throat does not get better in 7 days.  You have a fever and your symptoms suddenly get worse. Get help right away if:  You have trouble breathing.  You cannot swallow fluids, soft foods, or your saliva.  You have swelling in your throat or neck that gets worse.  You keep feeling like you are going to throw up (vomit).  You keep throwing up. This information is not intended to replace advice given to you  by your health care provider. Make sure you discuss any questions you have with your health care provider. Document Released: 08/14/2008 Document Revised: 07/01/2016 Document Reviewed: 08/26/2015 Elsevier Interactive Patient Education  [employer]2018 [employer]

## 2018-10-03 NOTE — Progress Notes (Signed)
Subjective:  Patient ID: Mark Friedman., male    DOB: 17-Nov-1971  Age: 47 y.o. MRN: [id number]  CC: Sore Throat (pt is complianing of sore throat,going on 2 days. took otc. request inhaler? note for work. )   Sore Throat   This is a new problem. The current episode started in the past 7 days. The problem has been unchanged. The pain is worse on the right side. There has been no fever. The pain is moderate. Associated symptoms include congestion and a hoarse voice. Pertinent negatives include no coughing, ear discharge, ear pain, headaches, plugged ear sensation, neck pain, shortness of breath, stridor, swollen glands, trouble swallowing or vomiting. He has had no exposure to strep or mono. He has tried nothing for the symptoms.   Reviewed past Medical, Social and Family history today.  Outpatient Medications Prior to Visit  Medication Sig Dispense Refill  . albuterol (PROVENTIL HFA;VENTOLIN HFA) 108 (90 Base) MCG/ACT inhaler Inhale into the lungs.    Marland Kitchen albuterol (PROVENTIL HFA;VENTOLIN HFA) 108 (90 Base) MCG/ACT inhaler Inhale 2 puffs into the lungs every 6 (six) hours as needed for wheezing. 1 Inhaler 2  . ascorbic acid (VITAMIN C) 250 MG CHEW Chew 250 mg by mouth daily.    Marland Kitchen ibuprofen (ADVIL,MOTRIN) 600 MG tablet Take 1 tablet (600 mg total) by mouth every 6 (six) hours as needed. (Patient not taking: Reported on 10/03/2018) 30 tablet 0   No facility-administered medications prior to visit.     ROS See HPI  Objective:  BP 106/70   Pulse 77   Temp 97.7 F (36.5 C) (Oral)   Ht 5\' 10"  (1.778 m)   Wt 227 lb (103 kg)   SpO2 95%   BMI 32.57 kg/m   BP Readings from Last 3 Encounters:  10/03/18 106/70  08/06/18 118/73  07/11/18 140/83    Wt Readings from Last 3 Encounters:  10/03/18 227 lb (103 kg)  08/06/18 225 lb (102.1 kg)  07/11/18 225 lb (102.1 kg)    Physical Exam  Constitutional: He is oriented to person, place, and time. No distress.  HENT:  Right Ear: Tympanic  membrane and ear canal normal.  Left Ear: Tympanic membrane and ear canal normal.  Mouth/Throat: Mucous membranes are normal. No oral lesions. No uvula swelling. Posterior oropharyngeal erythema present. No oropharyngeal exudate or posterior oropharyngeal edema. Tonsils are 0 on the right. Tonsils are 0 on the left.  Neck: Normal range of motion. No thyromegaly present.  Cardiovascular: Normal rate.  Pulmonary/Chest: Effort normal.  Lymphadenopathy:    He has no cervical adenopathy.  Neurological: He is alert and oriented to person, place, and time.  Skin: No rash noted.  Vitals reviewed.   Lab Results  Component Value Date   WBC 6.1 10/21/2017   HGB 14.3 10/21/2017   HCT 42.2 10/21/2017   PLT 195.0 10/21/2017   GLUCOSE 88 10/21/2017   CHOL 219 (H) 10/21/2017   TRIG 70.0 10/21/2017   HDL 58.60 10/21/2017   LDLCALC 147 (H) 10/21/2017   ALT 19 10/21/2017   AST 22 10/21/2017   NA 139 10/21/2017   K 4.1 10/21/2017   CL 102 10/21/2017   CREATININE 0.67 10/21/2017   BUN 17 10/21/2017   CO2 28 10/21/2017   HGBA1C 5.4 10/21/2017    Dg Inject Diag/thera/inc Needle/cath/plc Epi/lumb/sac W/img  Result Date: 09/10/2017 CLINICAL DATA:  Lumbosacral spondylosis without myelopathy. Chronic low back and left lower extremity pain. FLUOROSCOPY TIME:  Radiation Exposure Index (as provided  by the fluoroscopic device): 30.6 Gy*m2 Fluoroscopy Time:  24 seconds. Number of Acquired Images:  0 PROCEDURE: The procedure, risks, benefits, and alternatives were explained to the patient. Questions regarding the procedure were encouraged and answered. The patient understands and consents to the procedure. LUMBAR EPIDURAL INJECTION: An interlaminar approach was performed on left at L3-L4. The overlying skin was cleansed and anesthetized. A 3.5 inch 20 gauge epidural needle was advanced using loss-of-resistance technique. DIAGNOSTIC EPIDURAL INJECTION: Injection of Isovue-M 200 shows a good epidural pattern  with spread above and below the level of needle placement, primarily on the left. No vascular opacification is seen. THERAPEUTIC EPIDURAL INJECTION: 120 mg of Depo-Medrol mixed with 3 mL of 1% lidocaine were instilled. The procedure was well-tolerated, and the patient was discharged thirty minutes following the injection in good condition. COMPLICATIONS: None immediate. IMPRESSION: Technically successful epidural injection on the left at L3-L4 #1. Electronically Signed   By: Obie DredgeWilliam T Derry M.D.   On: 09/10/2017 15:34    Assessment & Plan:   Mark Friedman was seen today for sore throat.  Diagnoses and all orders for this visit:  Sore throat and laryngitis -     POCT rapid strep A -     dextromethorphan-guaiFENesin (MUCINEX DM) 30-600 MG 12hr tablet; Take 1 tablet by mouth 2 (two) times daily as needed for cough. -     cetirizine-pseudoephedrine (ZYRTEC-D) 5-120 MG tablet; Take 1 tablet by mouth 2 (two) times daily for 3 days.  Nasal congestion -     dextromethorphan-guaiFENesin (MUCINEX DM) 30-600 MG 12hr tablet; Take 1 tablet by mouth 2 (two) times daily as needed for cough. -     cetirizine-pseudoephedrine (ZYRTEC-D) 5-120 MG tablet; Take 1 tablet by mouth 2 (two) times daily for 3 days.   I am having Mark MareLewis Lavine Jr. start on dextromethorphan-guaiFENesin and cetirizine-pseudoephedrine. I am also having him maintain his ascorbic acid, ibuprofen, albuterol, and albuterol.  Meds ordered this encounter  Medications  . dextromethorphan-guaiFENesin (MUCINEX DM) 30-600 MG 12hr tablet    Sig: Take 1 tablet by mouth 2 (two) times daily as needed for cough.    Dispense:  14 tablet    Refill:  0    Order Specific Question:   Supervising Provider    Answer:   Dianne DunARON, TALIA M [[id number]]  . cetirizine-pseudoephedrine (ZYRTEC-D) 5-120 MG tablet    Sig: Take 1 tablet by mouth 2 (two) times daily for 3 days.    Dispense:  6 tablet    Refill:  0    Order Specific Question:   Supervising Provider    Answer:    Dianne DunARON, TALIA M [[id number]]    Follow-up: No follow-ups on file.  Mark Pennaharlotte Nche, NP

## 2018-10-10 IMAGING — XA Imaging study
2 series · 2 of 2 positions shown · non-contrast
Comparison: none

CLINICAL DATA: Lumbosacral spondylosis without myelopathy. Chronic
low back and left lower extremity pain.

[Series 1: ortho standard · 1 of 1 slices shown (1 of 2)]
[im 1/1]
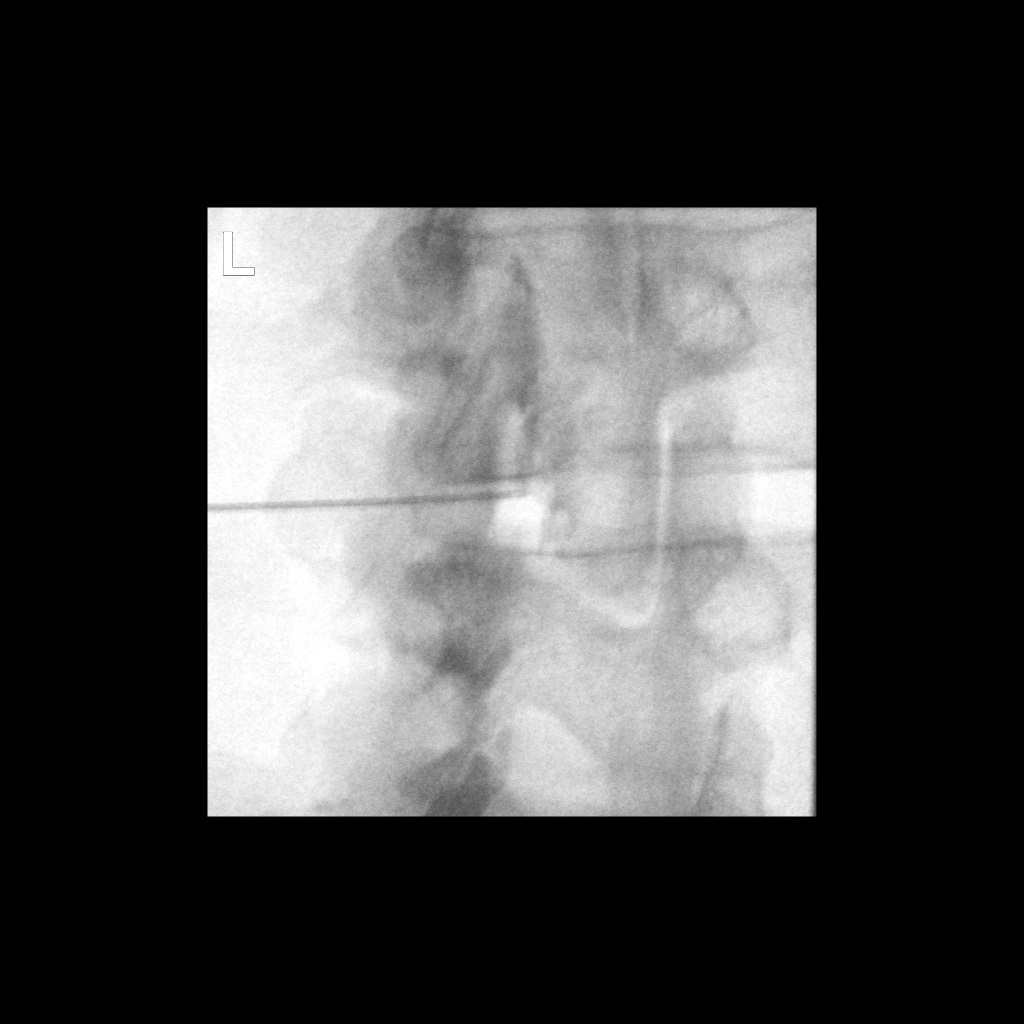

[Series 2: ortho standard · 1 of 1 slices shown (2 of 2)]
[im 1/1]
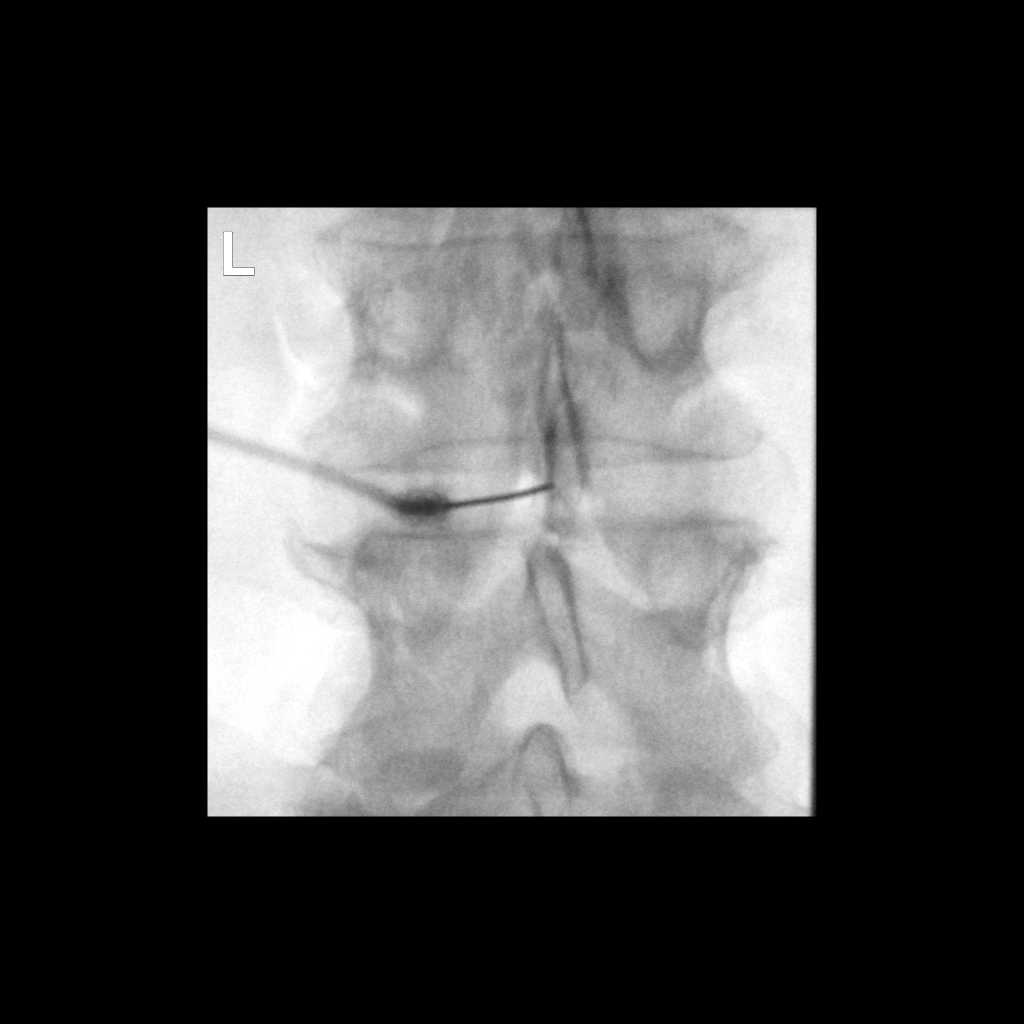

[2 of 2 positions shown; findings below may reference images not displayed]

FLUOROSCOPY TIME:  Radiation Exposure Index (as provided by the
fluoroscopic device): 30.6 ?Gy*m2

Fluoroscopy Time:  24 seconds.

Number of Acquired Images:  0

PROCEDURE:
The procedure, risks, benefits, and alternatives were explained to
the patient. Questions regarding the procedure were encouraged and
answered. The patient understands and consents to the procedure.

LUMBAR EPIDURAL INJECTION:

An interlaminar approach was performed on left at L3-L4. The
overlying skin was cleansed and anesthetized. A 3.5 inch 20 gauge
epidural needle was advanced using loss-of-resistance technique.

DIAGNOSTIC EPIDURAL INJECTION:

Injection of Isovue-M 200 shows a good epidural pattern with spread
above and below the level of needle placement, primarily on the
left. No vascular opacification is seen.

THERAPEUTIC EPIDURAL INJECTION:

120 mg of Depo-Medrol mixed with 3 mL of 1% lidocaine were
instilled. The procedure was well-tolerated, and the patient was
discharged thirty minutes following the injection in good condition.

COMPLICATIONS:
None immediate.
IMPRESSION: Technically successful epidural injection on the left at L3-L4 #1.

## 2018-10-27 ENCOUNTER — Ambulatory Visit: Payer: BLUE CROSS/BLUE SHIELD | Admitting: Nurse Practitioner

## 2019-01-14 ENCOUNTER — Encounter: Payer: Self-pay | Admitting: Family Medicine

## 2019-01-14 ENCOUNTER — Ambulatory Visit: Payer: BLUE CROSS/BLUE SHIELD | Admitting: Family Medicine

## 2019-01-14 VITALS — BP 120/72 | HR 61 | Temp 98.2°F | Ht 70.0 in | Wt 240.5 lb

## 2019-01-14 DIAGNOSIS — M545 Low back pain, unspecified: Secondary | ICD-10-CM

## 2019-01-14 DIAGNOSIS — Z9109 Other allergy status, other than to drugs and biological substances: Secondary | ICD-10-CM

## 2019-01-14 MED ORDER — PREDNISONE 20 MG PO TABS: 40.0000 mg | ORAL_TABLET | Freq: Every day | ORAL | 0 refills | Status: AC

## 2019-01-14 MED ORDER — ALBUTEROL SULFATE HFA 108 (90 BASE) MCG/ACT IN AERS: 2.0000 | INHALATION_SPRAY | Freq: Four times a day (QID) | RESPIRATORY_TRACT | 2 refills | Status: DC | PRN

## 2019-01-14 MED ORDER — CYCLOBENZAPRINE HCL 5 MG PO TABS: 5.0000 mg | ORAL_TABLET | Freq: Three times a day (TID) | ORAL | 0 refills | Status: DC | PRN

## 2019-01-14 NOTE — Patient Instructions (Addendum)
Ice/cold pack over area for 10-15 min twice daily.  OK to take Tylenol 1000 mg (2 extra strength tabs) or 975 mg (3 regular strength tabs) every 6 hours as needed.  Take Flexeril (cyclobenzaprine) around 5-6 PM. If it makes you drowsy, do not take during the day. You can try half a tab the following night.  EXERCISES  RANGE OF MOTION (ROM) AND STRETCHING EXERCISES - Low Back Pain Most people with lower back pain will find that their symptoms get worse with excessive bending forward (flexion) or arching at the lower back (extension). The exercises that will help resolve your symptoms will focus on the opposite motion.  If you have pain, numbness or tingling which travels down into your buttocks, leg or foot, the goal of the therapy is for these symptoms to move closer to your back and eventually resolve. Sometimes, these leg symptoms will get better, but your lower back pain may worsen. This is often an indication of progress in your rehabilitation. Be very alert to any changes in your symptoms and the activities in which you participated in the 24 hours prior to the change. Sharing this information with your caregiver will allow him or her to most efficiently treat your condition. These exercises may help you when beginning to rehabilitate your injury. Your symptoms may resolve with or without further involvement from your physician, physical therapist or athletic trainer. While completing these exercises, remember:   Restoring tissue flexibility helps normal motion to return to the joints. This allows healthier, less painful movement and activity.  An effective stretch should be held for at least 30 seconds.  A stretch should never be painful. You should only feel a gentle lengthening or release in the stretched tissue. FLEXION RANGE OF MOTION AND STRETCHING EXERCISES:  STRETCH - Flexion, Single Knee to Chest   Lie on a firm bed or floor with both legs extended in front of you.  Keeping one  leg in contact with the floor, bring your opposite knee to your chest. Hold your leg in place by either grabbing behind your thigh or at your knee.  Pull until you feel a gentle stretch in your low back. Hold 30 seconds.  Slowly release your grasp and repeat the exercise with the opposite side. Repeat 2 times. Complete this exercise 3 times per week.   STRETCH - Flexion, Double Knee to Chest  Lie on a firm bed or floor with both legs extended in front of you.  Keeping one leg in contact with the floor, bring your opposite knee to your chest.  Tense your stomach muscles to support your back and then lift your other knee to your chest. Hold your legs in place by either grabbing behind your thighs or at your knees.  Pull both knees toward your chest until you feel a gentle stretch in your low back. Hold 30 seconds.  Tense your stomach muscles and slowly return one leg at a time to the floor. Repeat 2 times. Complete this exercise 3 times per week.   STRETCH - Low Trunk Rotation  Lie on a firm bed or floor. Keeping your legs in front of you, bend your knees so they are both pointed toward the ceiling and your feet are flat on the floor.  Extend your arms out to the side. This will stabilize your upper body by keeping your shoulders in contact with the floor.  Gently and slowly drop both knees together to one side until you feel a gentle  stretch in your low back. Hold for 30 seconds.  Tense your stomach muscles to support your lower back as you bring your knees back to the starting position. Repeat the exercise to the other side. Repeat 2 times. Complete this exercise at least 3 times per week.   EXTENSION RANGE OF MOTION AND FLEXIBILITY EXERCISES:  STRETCH - Extension, Prone on Elbows   Lie on your stomach on the floor, a bed will be too soft. Place your palms about shoulder width apart and at the height of your head.  Place your elbows under your shoulders. If this is too painful,  stack pillows under your chest.  Allow your body to relax so that your hips drop lower and make contact more completely with the floor.  Hold this position for 30 seconds.  Slowly return to lying flat on the floor. Repeat 2 times. Complete this exercise 3 times per week.   RANGE OF MOTION - Extension, Prone Press Ups  Lie on your stomach on the floor, a bed will be too soft. Place your palms about shoulder width apart and at the height of your head.  Keeping your back as relaxed as possible, slowly straighten your elbows while keeping your hips on the floor. You may adjust the placement of your hands to maximize your comfort. As you gain motion, your hands will come more underneath your shoulders.  Hold this position 30 seconds.  Slowly return to lying flat on the floor. Repeat 2 times. Complete this exercise 3 times per week.   RANGE OF MOTION- Quadruped, Neutral Spine   Assume a hands and knees position on a firm surface. Keep your hands under your shoulders and your knees under your hips. You may place padding under your knees for comfort.  Drop your head and point your tailbone toward the ground below you. This will round out your lower back like an angry cat. Hold this position for 30 seconds.  Slowly lift your head and release your tail bone so that your back sags into a large arch, like an old horse.  Hold this position for 30 seconds.  Repeat this until you feel limber in your low back.  Now, find your "sweet spot." This will be the most comfortable position somewhere between the two previous positions. This is your neutral spine. Once you have found this position, tense your stomach muscles to support your low back.  Hold this position for 30 seconds. Repeat 2 times. Complete this exercise 3 times per week.   STRENGTHENING EXERCISES - Low Back Sprain These exercises may help you when beginning to rehabilitate your injury. These exercises should be done near your "sweet  spot." This is the neutral, low-back arch, somewhere between fully rounded and fully arched, that is your least painful position. When performed in this safe range of motion, these exercises can be used for people who have either a flexion or extension based injury. These exercises may resolve your symptoms with or without further involvement from your physician, physical therapist or athletic trainer. While completing these exercises, remember:   Muscles can gain both the endurance and the strength needed for everyday activities through controlled exercises.  Complete these exercises as instructed by your physician, physical therapist or athletic trainer. Increase the resistance and repetitions only as guided.  You may experience muscle soreness or fatigue, but the pain or discomfort you are trying to eliminate should never worsen during these exercises. If this pain does worsen, stop and make certain  you are following the directions exactly. If the pain is still present after adjustments, discontinue the exercise until you can discuss the trouble with your caregiver.  STRENGTHENING - Deep Abdominals, Pelvic Tilt   Lie on a firm bed or floor. Keeping your legs in front of you, bend your knees so they are both pointed toward the ceiling and your feet are flat on the floor.  Tense your lower abdominal muscles to press your low back into the floor. This motion will rotate your pelvis so that your tail bone is scooping upwards rather than pointing at your feet or into the floor. With a gentle tension and even breathing, hold this position for 3 seconds. Repeat 2 times. Complete this exercise 3 times per week.   STRENGTHENING - Abdominals, Crunches   Lie on a firm bed or floor. Keeping your legs in front of you, bend your knees so they are both pointed toward the ceiling and your feet are flat on the floor. Cross your arms over your chest.  Slightly tip your chin down without bending your  neck.  Tense your abdominals and slowly lift your trunk high enough to just clear your shoulder blades. Lifting higher can put excessive stress on the lower back and does not further strengthen your abdominal muscles.  Control your return to the starting position. Repeat 2 times. Complete this exercise 3 times per week.   STRENGTHENING - Quadruped, Opposite UE/LE Lift   Assume a hands and knees position on a firm surface. Keep your hands under your shoulders and your knees under your hips. You may place padding under your knees for comfort.  Find your neutral spine and gently tense your abdominal muscles so that you can maintain this position. Your shoulders and hips should form a rectangle that is parallel with the floor and is not twisted.  Keeping your trunk steady, lift your right hand no higher than your shoulder and then your left leg no higher than your hip. Make sure you are not holding your breath. Hold this position for 30 seconds.  Continuing to keep your abdominal muscles tense and your back steady, slowly return to your starting position. Repeat with the opposite arm and leg. Repeat 2 times. Complete this exercise 3 times per week.   STRENGTHENING - Abdominals and Quadriceps, Straight Leg Raise   Lie on a firm bed or floor with both legs extended in front of you.  Keeping one leg in contact with the floor, bend the other knee so that your foot can rest flat on the floor.  Find your neutral spine, and tense your abdominal muscles to maintain your spinal position throughout the exercise.  Slowly lift your straight leg off the floor about 6 inches for a count of 3, making sure to not hold your breath.  Still keeping your neutral spine, slowly lower your leg all the way to the floor. Repeat this exercise with each leg 2 times. Complete this exercise 3 times per week.  POSTURE AND BODY MECHANICS CONSIDERATIONS - Low Back Sprain Keeping correct posture when sitting, standing or  completing your activities will reduce the stress put on different body tissues, allowing injured tissues a chance to heal and limiting painful experiences. The following are general guidelines for improved posture.  While reading these guidelines, remember:  The exercises prescribed by your provider will help you have the flexibility and strength to maintain correct postures.  The correct posture provides the best environment for your joints to work.  All of your joints have less wear and tear when properly supported by a spine with good posture. This means you will experience a healthier, less painful body.  Correct posture must be practiced with all of your activities, especially prolonged sitting and standing. Correct posture is as important when doing repetitive low-stress activities (typing) as it is when doing a single heavy-load activity (lifting).  RESTING POSITIONS Consider which positions are most painful for you when choosing a resting position. If you have pain with flexion-based activities (sitting, bending, stooping, squatting), choose a position that allows you to rest in a less flexed posture. You would want to avoid curling into a fetal position on your side. If your pain worsens with extension-based activities (prolonged standing, working overhead), avoid resting in an extended position such as sleeping on your stomach. Most people will find more comfort when they rest with their spine in a more neutral position, neither too rounded nor too arched. Lying on a non-sagging bed on your side with a pillow between your knees, or on your back with a pillow under your knees will often provide some relief. Keep in mind, being in any one position for a prolonged period of time, no matter how correct your posture, can still lead to stiffness.  PROPER SITTING POSTURE In order to minimize stress and discomfort on your spine, you must sit with correct posture. Sitting with good posture should be  effortless for a healthy body. Returning to good posture is a gradual process. Many people can work toward this most comfortably by using various supports until they have the flexibility and strength to maintain this posture on their own. When sitting with proper posture, your ears will fall over your shoulders and your shoulders will fall over your hips. You should use the back of the chair to support your upper back. Your lower back will be in a neutral position, just slightly arched. You may place a small pillow or folded towel at the base of your lower back for  support.  When working at a desk, create an environment that supports good, upright posture. Without extra support, muscles tire, which leads to excessive strain on joints and other tissues. Keep these recommendations in mind:  CHAIR:  A chair should be able to slide under your desk when your back makes contact with the back of the chair. This allows you to work closely.  The chair's height should allow your eyes to be level with the upper part of your monitor and your hands to be slightly lower than your elbows.  BODY POSITION  Your feet should make contact with the floor. If this is not possible, use a foot rest.  Keep your ears over your shoulders. This will reduce stress on your neck and low back.  INCORRECT SITTING POSTURES  If you are feeling tired and unable to assume a healthy sitting posture, do not slouch or slump. This puts excessive strain on your back tissues, causing more damage and pain. Healthier options include:  Using more support, like a lumbar pillow.  Switching tasks to something that requires you to be upright or walking.  Talking a brief walk.  Lying down to rest in a neutral-spine position.  PROLONGED STANDING WHILE SLIGHTLY LEANING FORWARD  When completing a task that requires you to lean forward while standing in one place for a long time, place either foot up on a stationary 2-4 inch high object to  help maintain the best posture. When both feet  are on the ground, the lower back tends to lose its slight inward curve. If this curve flattens (or becomes too large), then the back and your other joints will experience too much stress, tire more quickly, and can cause pain.  CORRECT STANDING POSTURES Proper standing posture should be assumed with all daily activities, even if they only take a few moments, like when brushing your teeth. As in sitting, your ears should fall over your shoulders and your shoulders should fall over your hips. You should keep a slight tension in your abdominal muscles to brace your spine. Your tailbone should point down to the ground, not behind your body, resulting in an over-extended swayback posture.   INCORRECT STANDING POSTURES  Common incorrect standing postures include a forward head, locked knees and/or an excessive swayback. WALKING Walk with an upright posture. Your ears, shoulders and hips should all line-up.  PROLONGED ACTIVITY IN A FLEXED POSITION When completing a task that requires you to bend forward at your waist or lean over a low surface, try to find a way to stabilize 3 out of 4 of your limbs. You can place a hand or elbow on your thigh or rest a knee on the surface you are reaching across. This will provide you more stability, so that your muscles do not tire as quickly. By keeping your knees relaxed, or slightly bent, you will also reduce stress across your lower back. CORRECT LIFTING TECHNIQUES  DO :  Assume a wide stance. This will provide you more stability and the opportunity to get as close as possible to the object which you are lifting.  Tense your abdominals to brace your spine. Bend at the knees and hips. Keeping your back locked in a neutral-spine position, lift using your leg muscles. Lift with your legs, keeping your back straight.  Test the weight of unknown objects before attempting to lift them.  Try to keep your elbows locked  down at your sides in order get the best strength from your shoulders when carrying an object.     Always ask for help when lifting heavy or awkward objects. INCORRECT LIFTING TECHNIQUES DO NOT:   Lock your knees when lifting, even if it is a small object.  Bend and twist. Pivot at your feet or move your feet when needing to change directions.  Assume that you can safely pick up even a paperclip without proper posture.

## 2019-01-14 NOTE — Progress Notes (Signed)
Musculoskeletal Exam  Patient: Mark Friedman. DOB: 06/27/1971  DOS: 01/14/2019  SUBJECTIVE:  Chief Complaint:   Chief Complaint  Patient presents with  . Fall  . Back Pain    Mark Friedman. is a 48 y.o.  male for evaluation and treatment of his back pain.   Onset:  1 day ago.  Fell yesterday, onto back.  Location: lower lower Character:  aching and shooting  Progression of issue:  is unchanged Associated symptoms: decreased rom, denies bruising, swelling, rashes Denies bowel/bladder incontinence or weakness Treatment: to date has been rest and bracing. Sees Dr. Pearletha Forge tomorrow.   Neurovascular symptoms: no  ROS: Musculoskeletal/Extremities: +back pain Neurologic: no numbness, tingling no weakness   Past Medical History:  Diagnosis Date  . Asthma   . Chronic low back pain   . Environmental allergies     Objective:  VITAL SIGNS: BP 120/72 (BP Location: Left Arm, Patient Position: Sitting, Cuff Size: Large)   Pulse 61   Temp 98.2 F (36.8 C) (Oral)   Ht 5\' 10"  (1.778 m)   Wt 240 lb 8 oz (109.1 kg)   SpO2 95%   BMI 34.51 kg/m  Constitutional: Well formed, well developed. No acute distress. HENT: Normocephalic, atraumatic.  Thorax & Lungs:  No accessory muscle use Extremities: No clubbing. No cyanosis. No edema.  Skin: Warm. Dry. No erythema. No rash.  Musculoskeletal: low back.   Tenderness to palpation: Yes, over lateral L lumbar region Deformity: no Ecchymosis: no Rashes: no Straight leg test: negative for Poor hamstring flexibility b/l. Neurologic: Normal sensory function. No focal deficits noted. DTR's equal and symmetry in LE's. No clonus. Psychiatric: Normal mood. Age appropriate judgment and insight. Alert & oriented x 3.    Assessment:  Acute left-sided low back pain without sciatica - Plan: predniSONE (DELTASONE) 20 MG tablet, cyclobenzaprine (FLEXERIL) 5 MG tablet  Plan: Pred burst, msc relaxant, warned of grogginess. Stretches/exercises,  heat, ice, Tylenol. F/u at earliest convenience for CPE, is following up w sports med tomorrow. Letter for work given. The patient voiced understanding and agreement to the plan.   Jilda Roche [place], DO 01/14/19  3:25 PM

## 2019-01-15 ENCOUNTER — Ambulatory Visit: Payer: BLUE CROSS/BLUE SHIELD | Admitting: Family Medicine

## 2019-01-15 ENCOUNTER — Encounter: Payer: Self-pay | Admitting: Family Medicine

## 2019-01-15 VITALS — BP 126/75 | Ht 70.0 in | Wt 240.0 lb

## 2019-01-15 DIAGNOSIS — M545 Low back pain, unspecified: Secondary | ICD-10-CM

## 2019-01-15 NOTE — Patient Instructions (Signed)
You have a lumbar strain. Continue with the prednisone and take the flexeril as needed. Ok to take tylenol for baseline pain relief (1-2 extra strength tabs 3x/day) as well. Stay as active as possible. Do home exercises and stretches as directed - hold each for 20-30 seconds and do each one three times. Consider physical therapy if not improving. Follow up with me in 2 weeks if you're not improving as expected.

## 2019-01-15 NOTE — Progress Notes (Signed)
  Subjective:     Patient ID: Mark Mare., male   DOB: 05/16/1971, 48 y.o.   MRN: [id number]  HPI Mark Friedman is a 47yo with PMHx of chronic low back pain who presents after a fall 2 days ago with new onset back pain. He reports that he fell backwards onto his back - more on the L side - when he was walking on the sidewalk. He had significant lower left-sided back pain after that that he described as sharp. It did not radiate down his leg. He did not notice any bruising or swelling of the area. He saw his PCP yesterday who started him on a prednisone dose pack and flexeril, which he took last night. This morning, he reported waking up with significantly improved symptoms, though the back pain returns occasionally with certain kinds of movements, namely flexion and left lateral rotation of the lumbar spine.   He also notes that he fell on his L shoulder, which he thinks is weak since the fall. He has not had significant pain in this shoulder, and emphasized that his back is more significantly impaired than his shoulder.  During spinal flexion exam, Mark Friedman reported some dizziness upon standing back up. He has not had other dizziness recently. He denies any recent infections, but says that he did have about 24H of diarrhea after he ate some suspicious foods over the weekend.   Review of Systems As above. No bowel/bladder dysfunction. No numbness.    Objective:   Physical Exam  Back: no swelling, ecchymoses, erythema, or deformity; no tenderness to palpation over the spine, paraspinal muscles, or elsewhere on the lower back; flexion of spine to about 70 degrees limited by pain, extension only slightly limited by pain, L lateral rotation of the spine causes pain and is limited in comparison to R lateral rotation; negative straight leg raise, knee flexion and extension and ankle flexion and extension are with full strength; sensation is equal on lateral and medial aspects of the lower leg bilaterally.   Negative logroll bilateral hips.  L shoulder: no effusion, ecchymoses, or erythema; no tenderness to palpation; full ROM with flexion, abduction, internal and external rotation; full strength with abduction and adduction  R shoulder: no effusion, ecchymoses, or erythema; no tenderness to palpation; full ROM with flexion, abduction, internal and external rotation; full strength with abduction and adduction    Assessment:     Mark Friedman likely has a strain of the muscles on the L side of his lower back. Not likely that this injury has any joint or bony components given his reassuring physical exam. His PCP prescribed prednisone and flexeril which have improved his symptoms significantly, and he should continue with this treatment for 2 weeks. His shoulder is benign on brief exam, and we can revisit this if it is giving him any trouble in two weeks when he comes for follow-up.    Plan:     - Gave back exercises - Instructed to continue with prednisone pack and flexeril as needed for pain - Follow up in 2 weeks if no improvement  Fujiko Picazo, MS4

## 2020-12-12 ENCOUNTER — Other Ambulatory Visit: Payer: Self-pay

## 2020-12-12 ENCOUNTER — Encounter: Payer: Self-pay | Admitting: Family Medicine

## 2020-12-12 ENCOUNTER — Telehealth (INDEPENDENT_AMBULATORY_CARE_PROVIDER_SITE_OTHER): Payer: BC Managed Care – PPO | Admitting: Family Medicine

## 2020-12-12 DIAGNOSIS — J4520 Mild intermittent asthma, uncomplicated: Secondary | ICD-10-CM | POA: Diagnosis not present

## 2020-12-12 DIAGNOSIS — Z9109 Other allergy status, other than to drugs and biological substances: Secondary | ICD-10-CM

## 2020-12-12 MED ORDER — ALBUTEROL SULFATE HFA 108 (90 BASE) MCG/ACT IN AERS: 2.0000 | INHALATION_SPRAY | Freq: Four times a day (QID) | RESPIRATORY_TRACT | 2 refills | Status: DC | PRN

## 2020-12-12 MED ORDER — LEVOCETIRIZINE DIHYDROCHLORIDE 5 MG PO TABS: 5.0000 mg | ORAL_TABLET | Freq: Every evening | ORAL | 2 refills | Status: DC

## 2020-12-12 NOTE — Progress Notes (Signed)
Chief Complaint  Patient presents with  . Wheezing    Refill inhaler    Subjective: Patient is a 50 y.o. male here for f/u asthma. Due to COVID-19 pandemic, we are interacting via web portal for an electronic face-to-face visit. I verified patient's ID using 2 identifiers. Patient agreed to proceed with visit via this method. Patient is at home, I am at office. Patient and I are present for visit.   Pt w a hx of mild intermittent asthma exacerbated by allergies and smoke. He takes albuterol intermittently. He needs a refill. Uses <2x/week on average. No SOB or current wheezing.   Hx of environmental allergies to cows, horses, mouse urine, molds, and grasses.   Past Medical History:  Diagnosis Date  . Asthma   . Chronic low back pain   . Environmental allergies     Objective: No conversational dyspnea Age appropriate judgment and insight Nml affect and mood  Assessment and Plan: Mild intermittent asthma without complication - Plan: albuterol (VENTOLIN HFA) 108 (90 Base) MCG/ACT inhaler  Environmental allergies - Plan: levocetirizine (XYZAL) 5 MG tablet  1. Cont SABA. Controlled overall.  2. Trial Xyzal during allergy seasons. He was taking a vitamin supp not a/w improving histamine rxn.  F/u in 6 mo for CPE or prn.  The patient voiced understanding and agreement to the plan.  Jilda Roche [place], DO 12/12/20  3:09 PM

## 2021-01-03 ENCOUNTER — Other Ambulatory Visit: Payer: Self-pay | Admitting: Family Medicine

## 2021-01-03 DIAGNOSIS — Z9109 Other allergy status, other than to drugs and biological substances: Secondary | ICD-10-CM

## 2021-04-12 ENCOUNTER — Ambulatory Visit: Payer: BC Managed Care – PPO | Admitting: Family Medicine

## 2021-04-12 ENCOUNTER — Other Ambulatory Visit: Payer: Self-pay

## 2021-04-12 DIAGNOSIS — M545 Low back pain, unspecified: Secondary | ICD-10-CM | POA: Diagnosis not present

## 2021-04-12 MED ORDER — CYCLOBENZAPRINE HCL 5 MG PO TABS: 5.0000 mg | ORAL_TABLET | Freq: Three times a day (TID) | ORAL | 0 refills | Status: DC | PRN

## 2021-04-12 NOTE — Patient Instructions (Signed)
You have a lumbar strain. Ok to take tylenol for baseline pain relief (1-2 extra strength tabs 3x/day) Continue your supplements and wearing the back braces when at work. Ok to take aleve 1-2 tabs twice a day with food for pain and inflammation if needed. Flexeril as needed for muscle spasms (no driving on this medicine if it makes you sleepy). Stay as active as possible. Do home exercises and stretches as directed - hold each for 20-30 seconds and do each one three times. Consider repeating physical therapy - call me if you want to do this before I see you back. Strengthening of low back muscles, abdominal musculature are key for long term pain relief. If not improving, will consider imaging. Follow up with me in 1 month.

## 2021-04-13 ENCOUNTER — Encounter: Payer: Self-pay | Admitting: Family Medicine

## 2021-04-13 NOTE — Progress Notes (Signed)
PCP: Sharlene Dory, DO  Subjective:   HPI: Patient is a 50 y.o. male here for low back pain.  Patient reports he's continued to have some low back pain but worse since this weekend. Thinks he might have aggravated this Friday or Saturday. Pain in middle of low back. No obvious radiation but does report intermittent anterior left knee numbness. Wearing back brace, work belt. Taking supplements including turmeric and multivitamin. Feels the pain with walking. No bowel/bladder dysfunction.  Past Medical History:  Diagnosis Date  . Asthma   . Chronic low back pain   . Environmental allergies     Current Outpatient Medications on File Prior to Visit  Medication Sig Dispense Refill  . albuterol (VENTOLIN HFA) 108 (90 Base) MCG/ACT inhaler Inhale 2 puffs into the lungs every 6 (six) hours as needed for wheezing. 1 each 2  . ascorbic acid (VITAMIN C) 250 MG CHEW Chew 250 mg by mouth daily.    Marland Kitchen levocetirizine (XYZAL) 5 MG tablet TAKE 1 TABLET BY MOUTH EVERY DAY IN THE EVENING 30 tablet 2   No current facility-administered medications on file prior to visit.    Past Surgical History:  Procedure Laterality Date  . NO PAST SURGERIES      No Known Allergies  Social History   Socioeconomic History  . Marital status: Married    Spouse name: Not on file  . Number of children: Not on file  . Years of education: Not on file  . Highest education level: Not on file  Occupational History  . Not on file  Tobacco Use  . Smoking status: Never Smoker  . Smokeless tobacco: Never Used  Substance and Sexual Activity  . Alcohol use: No  . Drug use: No  . Sexual activity: Yes    Partners: Female  Other Topics Concern  . Not on file  Social History Narrative  . Not on file   Social Determinants of Health   Financial Resource Strain: Not on file  Food Insecurity: Not on file  Transportation Needs: Not on file  Physical Activity: Not on file  Stress: Not on file  Social  Connections: Not on file  Intimate Partner Violence: Not on file    Family History  Problem Relation Age of Onset  . Cancer Neg Hx     BP 128/86   Ht 5' 9.5" (1.765 m)   Wt 250 lb (113.4 kg)   BMI 36.39 kg/m   No flowsheet data found.  No flowsheet data found.  Review of Systems: See HPI above.     Objective:  Physical Exam:  Gen: NAD, comfortable in exam room  Back: No gross deformity, scoliosis. TTP minimally paraspinal lumbar regions.  No midline or bony TTP. FROM. Strength LEs 5/5 all muscle groups.   Trace MSRs in patellar and achilles tendons, equal bilaterally. Negative SLRs. Sensation intact to light touch bilaterally.   Assessment & Plan:  1. Low back pain - no red flag signs or symptoms.  Consistent with lumbar strain.  Tylenol, aleve with flexeril if needed.  Home exercises and stretches reviewed.  Back brace when at work but do not rely on this.  Consider formal physical therapy, imaging.  F/u in 1 month.

## 2021-05-08 ENCOUNTER — Other Ambulatory Visit: Payer: Self-pay | Admitting: Family Medicine

## 2021-05-08 DIAGNOSIS — M545 Low back pain, unspecified: Secondary | ICD-10-CM

## 2021-05-17 ENCOUNTER — Ambulatory Visit: Payer: BC Managed Care – PPO | Admitting: Family Medicine

## 2021-06-05 ENCOUNTER — Other Ambulatory Visit: Payer: Self-pay | Admitting: Family Medicine

## 2021-06-05 DIAGNOSIS — Z9109 Other allergy status, other than to drugs and biological substances: Secondary | ICD-10-CM

## 2021-09-11 ENCOUNTER — Other Ambulatory Visit: Payer: Self-pay | Admitting: Family Medicine

## 2021-09-11 DIAGNOSIS — Z9109 Other allergy status, other than to drugs and biological substances: Secondary | ICD-10-CM

## 2021-12-01 ENCOUNTER — Ambulatory Visit: Payer: BC Managed Care – PPO | Admitting: Family Medicine

## 2021-12-01 ENCOUNTER — Encounter: Payer: Self-pay | Admitting: Family Medicine

## 2021-12-01 VITALS — BP 124/78 | HR 72 | Temp 78.5°F | Resp 18 | Ht 70.0 in | Wt 284.4 lb

## 2021-12-01 DIAGNOSIS — M545 Low back pain, unspecified: Secondary | ICD-10-CM | POA: Diagnosis not present

## 2021-12-01 DIAGNOSIS — Z9109 Other allergy status, other than to drugs and biological substances: Secondary | ICD-10-CM | POA: Diagnosis not present

## 2021-12-01 DIAGNOSIS — J4520 Mild intermittent asthma, uncomplicated: Secondary | ICD-10-CM | POA: Diagnosis not present

## 2021-12-01 DIAGNOSIS — G8929 Other chronic pain: Secondary | ICD-10-CM

## 2021-12-01 DIAGNOSIS — Z6841 Body Mass Index (BMI) 40.0 and over, adult: Secondary | ICD-10-CM

## 2021-12-01 MED ORDER — FLUTICASONE PROPIONATE HFA 110 MCG/ACT IN AERO: INHALATION_SPRAY | RESPIRATORY_TRACT | 1 refills | Status: DC

## 2021-12-01 MED ORDER — CYCLOBENZAPRINE HCL 5 MG PO TABS: 5.0000 mg | ORAL_TABLET | Freq: Three times a day (TID) | ORAL | 0 refills | Status: DC | PRN

## 2021-12-01 MED ORDER — ALBUTEROL SULFATE HFA 108 (90 BASE) MCG/ACT IN AERS: 2.0000 | INHALATION_SPRAY | Freq: Four times a day (QID) | RESPIRATORY_TRACT | 2 refills | Status: DC | PRN

## 2021-12-01 MED ORDER — LEVOCETIRIZINE DIHYDROCHLORIDE 5 MG PO TABS: ORAL_TABLET | ORAL | 2 refills | Status: DC

## 2021-12-01 NOTE — Progress Notes (Signed)
Chief Complaint  Patient presents with   Follow-up    Concerns/ questions: allergies, wheezing has been trying some OTC tx and Xyzal- no relief. He would like to see an Allergist,     Subjective: Patient is a 51 y.o. male here for f/u.  Asthma- Mild intermittent on albuterol. Allergens, irritants and illness serve as triggers. Having some lingering wheezing. Coughs a lot during flares. Currently breathing fine. No recent hospitalization or intubation. Takes Xyzal as well. Interested in seeing allergist.   Hx of low back pain. Takes Flexeril prn. This usually helps. No bowel/bladder incontinence or neuro s/s's. No current issues at this time.   Past Medical History:  Diagnosis Date   Asthma    Chronic low back pain    Environmental allergies     Objective: BP 124/78 (BP Location: Left Arm, Patient Position: Sitting, Cuff Size: Large)    Pulse 72    Temp (!) 78.5 F (25.8 C) (Oral)    Resp 18    Ht 5\' 10"  (1.778 m)    Wt 284 lb 6.4 oz (129 kg)    SpO2 95%    BMI 40.81 kg/m  General: Awake, appears stated age HEENT: Ears neg b/l, nares patent wo dc, MMM, no pharyngeal exudate or erythema Heart: RRR, no LE edema Lungs: CTAB, no rales, wheezes or rhonchi. No accessory muscle use Psych: Age appropriate judgment and insight, normal affect and mood  Assessment and Plan: Mild intermittent asthma without complication - Plan: albuterol (VENTOLIN HFA) 108 (90 Base) MCG/ACT inhaler, Ambulatory referral to Allergy, fluticasone (FLOVENT HFA) 110 MCG/ACT inhaler  Environmental allergies - Plan: levocetirizine (XYZAL) 5 MG tablet, Ambulatory referral to Allergy  Chronic left-sided low back pain without sciatica  BMI 40.0-44.9, adult ([place]), Chronic  Chronic, currently stable. Cont albuterol as needed. Will add ICS for prn use in yellow zone. Chronic, stable. Cont Xyzal. Requesting referral to allergist, will set up.  Chronic, stable. Cont Flexeril prn.  F/u in 6 mo for CPE.  The patient  voiced understanding and agreement to the plan.  [place], DO 12/01/21  3:23 PM

## 2021-12-01 NOTE — Patient Instructions (Addendum)
If you do not hear anything about your referral in the next 1-2 weeks, call our office and ask for an update.  Asthma Action Plan There are 3 zones to consider: 1. Green Zone- This is the zone we hope to keep you in. Avoiding allergens and compliance with inhalers will keep you in this safe zone. No need to take anything extra while in this zone. 2. Yellow Zone- This zone is where we can save time, money and visits. You are not doing well enough to be considered in the green zone, but not bad enough to be in the red zone. This is where we need to remove any allergen or factor that is contributing to your breathing issues. You have a separate inhaler (inhaled steroid/Flovent) to take twice daily in addition to your other inhaler. This should give you the push you need to get back to the green zone. Contact our office if you have any questions. 3. Red Zone- This is the zone where you should consider calling 911 vs going to the ER. Despite compliance with inhalers/meds (or maybe because we haven't been using them), our breathing isn't where we want it to be. Albuterol isn't helping as much either and you need medical care. This is the zone we try to avoid as much as possible!  Let us know if you need anything.  EXERCISES  RANGE OF MOTION (ROM) AND STRETCHING EXERCISES - Low Back Pain Most people with lower back pain will find that their symptoms get worse with excessive bending forward (flexion) or arching at the lower back (extension). The exercises that will help resolve your symptoms will focus on the opposite motion.  If you have pain, numbness or tingling which travels down into your buttocks, leg or foot, the goal of the therapy is for these symptoms to move closer to your back and eventually resolve. Sometimes, these leg symptoms will get better, but your lower back pain may worsen. This is often an indication of progress in your rehabilitation. Be very alert to any changes in your symptoms and the  activities in which you participated in the 24 hours prior to the change. Sharing this information with your caregiver will allow him or her to most efficiently treat your condition. These exercises may help you when beginning to rehabilitate your injury. Your symptoms may resolve with or without further involvement from your physician, physical therapist or athletic trainer. While completing these exercises, remember:  Restoring tissue flexibility helps normal motion to return to the joints. This allows healthier, less painful movement and activity. An effective stretch should be held for at least 30 seconds. A stretch should never be painful. You should only feel a gentle lengthening or release in the stretched tissue. FLEXION RANGE OF MOTION AND STRETCHING EXERCISES:  STRETCH - Flexion, Single Knee to Chest  Lie on a firm bed or floor with both legs extended in front of you. Keeping one leg in contact with the floor, bring your opposite knee to your chest. Hold your leg in place by either grabbing behind your thigh or at your knee. Pull until you feel a gentle stretch in your low back. Hold 30 seconds. Slowly release your grasp and repeat the exercise with the opposite side. Repeat 2 times. Complete this exercise 3 times per week.   STRETCH - Flexion, Double Knee to Chest Lie on a firm bed or floor with both legs extended in front of you. Keeping one leg in contact with the floor, bring  your opposite knee to your chest. Tense your stomach muscles to support your back and then lift your other knee to your chest. Hold your legs in place by either grabbing behind your thighs or at your knees. Pull both knees toward your chest until you feel a gentle stretch in your low back. Hold 30 seconds. Tense your stomach muscles and slowly return one leg at a time to the floor. Repeat 2 times. Complete this exercise 3 times per week.   STRETCH - Low Trunk Rotation Lie on a firm bed or floor. Keeping your  legs in front of you, bend your knees so they are both pointed toward the ceiling and your feet are flat on the floor. Extend your arms out to the side. This will stabilize your upper body by keeping your shoulders in contact with the floor. Gently and slowly drop both knees together to one side until you feel a gentle stretch in your low back. Hold for 30 seconds. Tense your stomach muscles to support your lower back as you bring your knees back to the starting position. Repeat the exercise to the other side. Repeat 2 times. Complete this exercise at least 3 times per week.   EXTENSION RANGE OF MOTION AND FLEXIBILITY EXERCISES:  STRETCH - Extension, Prone on Elbows  Lie on your stomach on the floor, a bed will be too soft. Place your palms about shoulder width apart and at the height of your head. Place your elbows under your shoulders. If this is too painful, stack pillows under your chest. Allow your body to relax so that your hips drop lower and make contact more completely with the floor. Hold this position for 30 seconds. Slowly return to lying flat on the floor. Repeat 2 times. Complete this exercise 3 times per week.   RANGE OF MOTION - Extension, Prone Press Ups Lie on your stomach on the floor, a bed will be too soft. Place your palms about shoulder width apart and at the height of your head. Keeping your back as relaxed as possible, slowly straighten your elbows while keeping your hips on the floor. You may adjust the placement of your hands to maximize your comfort. As you gain motion, your hands will come more underneath your shoulders. Hold this position 30 seconds. Slowly return to lying flat on the floor. Repeat 2 times. Complete this exercise 3 times per week.   RANGE OF MOTION- Quadruped, Neutral Spine  Assume a hands and knees position on a firm surface. Keep your hands under your shoulders and your knees under your hips. You may place padding under your knees for  comfort. Drop your head and point your tailbone toward the ground below you. This will round out your lower back like an angry cat. Hold this position for 30 seconds. Slowly lift your head and release your tail bone so that your back sags into a large arch, like an old horse. Hold this position for 30 seconds. Repeat this until you feel limber in your low back. Now, find your "sweet spot." This will be the most comfortable position somewhere between the two previous positions. This is your neutral spine. Once you have found this position, tense your stomach muscles to support your low back. Hold this position for 30 seconds. Repeat 2 times. Complete this exercise 3 times per week.   STRENGTHENING EXERCISES - Low Back Sprain These exercises may help you when beginning to rehabilitate your injury. These exercises should be done near your "  sweet spot." This is the neutral, low-back arch, somewhere between fully rounded and fully arched, that is your least painful position. When performed in this safe range of motion, these exercises can be used for people who have either a flexion or extension based injury. These exercises may resolve your symptoms with or without further involvement from your physician, physical therapist or athletic trainer. While completing these exercises, remember:  Muscles can gain both the endurance and the strength needed for everyday activities through controlled exercises. Complete these exercises as instructed by your physician, physical therapist or athletic trainer. Increase the resistance and repetitions only as guided. You may experience muscle soreness or fatigue, but the pain or discomfort you are trying to eliminate should never worsen during these exercises. If this pain does worsen, stop and make certain you are following the directions exactly. If the pain is still present after adjustments, discontinue the exercise until you can discuss the trouble with your  caregiver.  STRENGTHENING - Deep Abdominals, Pelvic Tilt  Lie on a firm bed or floor. Keeping your legs in front of you, bend your knees so they are both pointed toward the ceiling and your feet are flat on the floor. Tense your lower abdominal muscles to press your low back into the floor. This motion will rotate your pelvis so that your tail bone is scooping upwards rather than pointing at your feet or into the floor. With a gentle tension and even breathing, hold this position for 3 seconds. Repeat 2 times. Complete this exercise 3 times per week.   STRENGTHENING - Abdominals, Crunches  Lie on a firm bed or floor. Keeping your legs in front of you, bend your knees so they are both pointed toward the ceiling and your feet are flat on the floor. Cross your arms over your chest. Slightly tip your chin down without bending your neck. Tense your abdominals and slowly lift your trunk high enough to just clear your shoulder blades. Lifting higher can put excessive stress on the lower back and does not further strengthen your abdominal muscles. Control your return to the starting position. Repeat 2 times. Complete this exercise 3 times per week.   STRENGTHENING - Quadruped, Opposite UE/LE Lift  Assume a hands and knees position on a firm surface. Keep your hands under your shoulders and your knees under your hips. You may place padding under your knees for comfort. Find your neutral spine and gently tense your abdominal muscles so that you can maintain this position. Your shoulders and hips should form a rectangle that is parallel with the floor and is not twisted. Keeping your trunk steady, lift your right hand no higher than your shoulder and then your left leg no higher than your hip. Make sure you are not holding your breath. Hold this position for 30 seconds. Continuing to keep your abdominal muscles tense and your back steady, slowly return to your starting position. Repeat with the opposite arm  and leg. Repeat 2 times. Complete this exercise 3 times per week.   STRENGTHENING - Abdominals and Quadriceps, Straight Leg Raise  Lie on a firm bed or floor with both legs extended in front of you. Keeping one leg in contact with the floor, bend the other knee so that your foot can rest flat on the floor. Find your neutral spine, and tense your abdominal muscles to maintain your spinal position throughout the exercise. Slowly lift your straight leg off the floor about 6 inches for a count of  3, making sure to not hold your breath. Still keeping your neutral spine, slowly lower your leg all the way to the floor. Repeat this exercise with each leg 2 times. Complete this exercise 3 times per week.  POSTURE AND BODY MECHANICS CONSIDERATIONS - Low Back Sprain Keeping correct posture when sitting, standing or completing your activities will reduce the stress put on different body tissues, allowing injured tissues a chance to heal and limiting painful experiences. The following are general guidelines for improved posture.  While reading these guidelines, remember: The exercises prescribed by your provider will help you have the flexibility and strength to maintain correct postures. The correct posture provides the best environment for your joints to work. All of your joints have less wear and tear when properly supported by a spine with good posture. This means you will experience a healthier, less painful body. Correct posture must be practiced with all of your activities, especially prolonged sitting and standing. Correct posture is as important when doing repetitive low-stress activities (typing) as it is when doing a single heavy-load activity (lifting).  RESTING POSITIONS Consider which positions are most painful for you when choosing a resting position. If you have pain with flexion-based activities (sitting, bending, stooping, squatting), choose a position that allows you to rest in a less flexed  posture. You would want to avoid curling into a fetal position on your side. If your pain worsens with extension-based activities (prolonged standing, working overhead), avoid resting in an extended position such as sleeping on your stomach. Most people will find more comfort when they rest with their spine in a more neutral position, neither too rounded nor too arched. Lying on a non-sagging bed on your side with a pillow between your knees, or on your back with a pillow under your knees will often provide some relief. Keep in mind, being in any one position for a prolonged period of time, no matter how correct your posture, can still lead to stiffness.  PROPER SITTING POSTURE In order to minimize stress and discomfort on your spine, you must sit with correct posture. Sitting with good posture should be effortless for a healthy body. Returning to good posture is a gradual process. Many people can work toward this most comfortably by using various supports until they have the flexibility and strength to maintain this posture on their own. When sitting with proper posture, your ears will fall over your shoulders and your shoulders will fall over your hips. You should use the back of the chair to support your upper back. Your lower back will be in a neutral position, just slightly arched. You may place a small pillow or folded towel at the base of your lower back for  support.  When working at a desk, create an environment that supports good, upright posture. Without extra support, muscles tire, which leads to excessive strain on joints and other tissues. Keep these recommendations in mind:  CHAIR: A chair should be able to slide under your desk when your back makes contact with the back of the chair. This allows you to work closely. The chair's height should allow your eyes to be level with the upper part of your monitor and your hands to be slightly lower than your elbows.  BODY POSITION Your feet  should make contact with the floor. If this is not possible, use a foot rest. Keep your ears over your shoulders. This will reduce stress on your neck and low back.  INCORRECT SITTING POSTURES  If you are feeling tired and unable to assume a healthy sitting posture, do not slouch or slump. This puts excessive strain on your back tissues, causing more damage and pain. Healthier options include: Using more support, like a lumbar pillow. Switching tasks to something that requires you to be upright or walking. Talking a brief walk. Lying down to rest in a neutral-spine position.  PROLONGED STANDING WHILE SLIGHTLY LEANING FORWARD  When completing a task that requires you to lean forward while standing in one place for a long time, place either foot up on a stationary 2-4 inch high object to help maintain the best posture. When both feet are on the ground, the lower back tends to lose its slight inward curve. If this curve flattens (or becomes too large), then the back and your other joints will experience too much stress, tire more quickly, and can cause pain.  CORRECT STANDING POSTURES Proper standing posture should be assumed with all daily activities, even if they only take a few moments, like when brushing your teeth. As in sitting, your ears should fall over your shoulders and your shoulders should fall over your hips. You should keep a slight tension in your abdominal muscles to brace your spine. Your tailbone should point down to the ground, not behind your body, resulting in an over-extended swayback posture.   INCORRECT STANDING POSTURES  Common incorrect standing postures include a forward head, locked knees and/or an excessive swayback. WALKING Walk with an upright posture. Your ears, shoulders and hips should all line-up.  PROLONGED ACTIVITY IN A FLEXED POSITION When completing a task that requires you to bend forward at your waist or lean over a low surface, try to find a way to  stabilize 3 out of 4 of your limbs. You can place a hand or elbow on your thigh or rest a knee on the surface you are reaching across. This will provide you more stability, so that your muscles do not tire as quickly. By keeping your knees relaxed, or slightly bent, you will also reduce stress across your lower back. CORRECT LIFTING TECHNIQUES  DO : Assume a wide stance. This will provide you more stability and the opportunity to get as close as possible to the object which you are lifting. Tense your abdominals to brace your spine. Bend at the knees and hips. Keeping your back locked in a neutral-spine position, lift using your leg muscles. Lift with your legs, keeping your back straight. Test the weight of unknown objects before attempting to lift them. Try to keep your elbows locked down at your sides in order get the best strength from your shoulders when carrying an object.   Always ask for help when lifting heavy or awkward objects. INCORRECT LIFTING TECHNIQUES DO NOT:  Lock your knees when lifting, even if it is a small object. Bend and twist. Pivot at your feet or move your feet when needing to change directions. Assume that you can safely pick up even a paperclip without proper posture.

## 2021-12-22 NOTE — Progress Notes (Signed)
NEW PATIENT Date of Service/Encounter:  12/25/21 Referring provider: Sharlene DoryWendling, Nicholas Paul* Primary care provider: Sharlene DoryWendling, Nicholas Paul, DO  Subjective:  Mark MareLewis Gilroy Jr. is a 51 y.o. male presenting today for evaluation of asthma and chronic rhinitis. History obtained from: chart review and patient and wife .   Having wheezing and nasal congestion for the past several months. Wheezing heard when he is exhaling.  Using albuterol about once per day for the past few days.  Did have some early in the morning.  Symptoms worse in morning or late at night.  Gets relief with the albuterol.    Asthma History:  -Diagnosed in childhood, had outgrown it for a while.  -Current symptoms include shortness of breath and wheezing daily daytime symptoms in past month, almost nightly nighttime awakenings in past month Using rescue inhaler almost daily -Limitations to daily activity: mild - 0 ED visits, 0 UC visits and 0 oral steroids in the past year - multiple occasions when he was a child he was hospitalized, not sure if he's ever been in ICU or required intubation - Identified Triggers:  Allergens and irritants and respiratory illness  - Smoking exposure: no smoking history, very limited smoke exposure Previous Diagnostics:   Management:  - Current regimen:  - Maintenance: Flovent 110, 2 puffs BID-but he was using this as his rescue - Rescue: Albuterol 2 puffs q4-6 hrs PRN, not using prior to exercise  Chronic rhinitis: started as a young child Symptoms include: nasal congestion  Treatments tried: Xyzal, can't do nasal sprays  Previous allergy testing:  Yes-blood drawl 2018-grasses negative, molds positive for Aspergillus and Candida , animal panel positive for mouse, nut panel negative Was on allergy shots, stopped 20 years ago (1998), did these when he lived in [place]-they were helpful to him History of reflux/heartburn:  yes-but usually only if he eats too much, states it hasn't  happened in a while  Food Allergy: He has been diagnosed with multiple food allergies in his past, but unclear if he is in fact avoiding any foods at this time.  He does have concern for possible food allergy, states he has had anaphyalxis many years ago, and the cause was never determined.  He would like to update his testing.   Other allergy screening: Medication allergy: no  Past Medical History: Past Medical History:  Diagnosis Date   Asthma    Chronic low back pain    Environmental allergies    Medication List:  Current Outpatient Medications  Medication Sig Dispense Refill   albuterol (VENTOLIN HFA) 108 (90 Base) MCG/ACT inhaler Inhale 2 puffs into the lungs every 6 (six) hours as needed for wheezing. 1 each 2   levocetirizine (XYZAL) 5 MG tablet TAKE 1 TABLET BY MOUTH EVERY DAY IN THE EVENING 30 tablet 2   fluticasone (FLOVENT HFA) 110 MCG/ACT inhaler Take 2 puffs twice daily while in your yellow zone. Rinse mouth out after use. (Patient not taking: Reported on 12/25/2021) 1 each 1   No current facility-administered medications for this visit.   Known Allergies:  No Known Allergies Past Surgical History: Past Surgical History:  Procedure Laterality Date   NO PAST SURGERIES     Family History: Family History  Problem Relation Age of Onset   Allergic rhinitis Mother    Asthma Mother    Asthma Father    Asthma Sister    Cancer Neg Hx    Eczema Neg Hx    Urticaria Neg Hx  Immunodeficiency Neg Hx    Angioedema Neg Hx    Atopy Neg Hx    Social History: Mark Friedman lives in a house bulit in [place], no water damage, + carpet, gas, wood and electric heating, window and central AC, dogs, no cockroaches, no DM protection, no smoke exposure, works as Pharmacist, hospital, 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   ROS:  All other systems negative except as noted per HPI.  Objective:  Blood pressure (!) 156/90,  pulse 90, temperature 97.7 F (36.5 C), temperature source Temporal, resp. rate 16, height 5\' 8"  (1.727 m), weight 285 lb (129.3 kg), SpO2 98 %. Body mass index is 43.33 kg/m. Physical Exam:  General Appearance:  Alert, cooperative, no distress, appears stated age  Head:  Normocephalic, without obvious abnormality, atraumatic  Eyes:  Conjunctiva clear, EOM's intact  Nose: Nares normal, hypertrophic turbinates, normal mucosa, no visible anterior polyps, and septum midline  Throat: Lips, tongue normal; teeth and gums normal, normal posterior oropharynx and + cobblestoning  Neck: Supple, symmetrical  Lungs:   end-expiratory wheezing, Respirations unlabored, no coughing  Heart:  regular rate and rhythm and no murmur, Appears well perfused  Extremities: No edema  Skin: Skin color, texture, turgor normal, no rashes or lesions on visualized portions of skin  Neurologic: No gross deficits     Diagnostics: Spirometry:  Tracings reviewed. His effort: Good reproducible efforts. FVC: 3.59L (pre), 4.29L  (post), +19% FEV1: 3.15L, 84% predicted (pre), 3.42L, 92% predicted (post) +9% FEV1/FVC ratio: 110% (pre), 100% (post) Interpretation: Spirometry consistent with normal pattern with partial bronchodilator response (+19% FVC, +9% FEV1)  Skin Testing:  deferred-awaiting insurance approval .   Assessment and Plan   Asthma uncontrolled per exam, spirometry shows partially significant improvement post-bronchodilator. Patient using inhalers incorrectly.  Reviewed asthma education in detail. Allergy testing deferred as he would like to establish cost with insurance.  Suspect allergic rhinitis. Plan as below.  Possible reflux component, but will consider therapy pending response to below plan. Patient Instructions  Moderate Persistent Asthma-uncontrolled: - your lung testing today looked okay, but expiratory wheezing heard on exam throughout all lung fields concerning for uncontrolled asthma -  make sure to verify which inhaler you are using based on instructions below  - Controller Inhaler: Start Flovent 110, 2 puffs twice a day; This Should Be Used Everyday - Rinse mouth out after use - Rescue Inhaler: Albuterol (Proair/Ventolin) 2 puffs . Use  every 4-6 hours as needed for chest tightness, wheezing, or coughing.  Can also use 15 minutes prior to exercise if you have symptoms with activity. - Asthma is not controlled if:  - Symptoms are occurring >2 times a week OR  - >2 times a month nighttime awakenings  - You are requiring systemic steroids (prednisone/steroid injections) more than once per year  - Your require hospitalization for your asthma.  - Please call the clinic to schedule a follow up if these symptoms arise  Chronic Rhinitis - uncontrolled: - call insurance to find out if allergy testing is covered  ( code is [id number]) - will avoid nasal sprays for now  - Continue over the counter antihistamine daily or daily as needed.   Use a NON-DROWSY option so that you can take the full amount. -Your options include Zyrtec (Cetirizine) 10mg , Claritin (Loratadine) 10mg , Allegra (Fexofenadine) 180mg , or Xyzal (Levocetirinze) 5mg   - Start Mucinex 600 mg twice a day as needed, drink plenty of water  History of allergic reaction, unknown cause:  - we can update food allergy testing at follow-up   Follow-up in 4 to 6 weeks for allergy testing and follow-up. It was a pleasure meeting you in clinic today!   Tonny Bollman, MD [facility]      This note in its entirety was forwarded to the Provider who requested this consultation.  Thank you for your kind referral. I appreciate the opportunity to take part in Mccauley's care. Please do not hesitate to contact me with questions.  Sincerely,  Tonny Bollman, MD [facility]

## 2021-12-25 ENCOUNTER — Ambulatory Visit: Payer: BC Managed Care – PPO | Admitting: Internal Medicine

## 2021-12-25 ENCOUNTER — Encounter: Payer: Self-pay | Admitting: Internal Medicine

## 2021-12-25 ENCOUNTER — Other Ambulatory Visit: Payer: Self-pay

## 2021-12-25 VITALS — BP 156/90 | HR 90 | Temp 97.7°F | Resp 16 | Ht 68.0 in | Wt 285.0 lb

## 2021-12-25 DIAGNOSIS — J4520 Mild intermittent asthma, uncomplicated: Secondary | ICD-10-CM | POA: Diagnosis not present

## 2021-12-25 DIAGNOSIS — J4541 Moderate persistent asthma with (acute) exacerbation: Secondary | ICD-10-CM

## 2021-12-25 DIAGNOSIS — J310 Chronic rhinitis: Secondary | ICD-10-CM

## 2021-12-25 DIAGNOSIS — T781XXA Other adverse food reactions, not elsewhere classified, initial encounter: Secondary | ICD-10-CM

## 2021-12-25 MED ORDER — ALBUTEROL SULFATE HFA 108 (90 BASE) MCG/ACT IN AERS: 2.0000 | INHALATION_SPRAY | RESPIRATORY_TRACT | 1 refills | Status: DC | PRN

## 2021-12-25 MED ORDER — FLUTICASONE PROPIONATE HFA 110 MCG/ACT IN AERO: INHALATION_SPRAY | RESPIRATORY_TRACT | 5 refills | Status: DC

## 2021-12-25 NOTE — Patient Instructions (Addendum)
Moderate Persistent Asthma-uncontrolled: - your lung testing today looked okay, but expiratory wheezing heard on exam throughout all lung fields concerning for uncontrolled asthma - make sure to verify which inhaler you are using based on instructions below  - Controller Inhaler: Start Flovent 110, 2 puffs twice a day; This Should Be Used Everyday - Rinse mouth out after use - Rescue Inhaler: Albuterol (Proair/Ventolin) 2 puffs . Use  every 4-6 hours as needed for chest tightness, wheezing, or coughing.  Can also use 15 minutes prior to exercise if you have symptoms with activity. - Asthma is not controlled if:  - Symptoms are occurring >2 times a week OR  - >2 times a month nighttime awakenings  - You are requiring systemic steroids (prednisone/steroid injections) more than once per year  - Your require hospitalization for your asthma.  - Please call the clinic to schedule a follow up if these symptoms arise  Chronic Rhinitis - uncontrolled: - call insurance to find out if allergy testing is covered  ( code is [id number]) - will avoid nasal sprays for now  - Continue over the counter antihistamine daily or daily as needed.   Use a NON-DROWSY option so that you can take the full amount. -Your options include Zyrtec (Cetirizine) 10mg , Claritin (Loratadine) 10mg , Allegra (Fexofenadine) 180mg , or Xyzal (Levocetirinze) 5mg   - Start Mucinex 600 mg twice a day as needed, drink plenty of water  History of allergic reaction, unknown cause:  - we can update food allergy testing at follow-up   Follow-up in 4 to 6 weeks for allergy testing and follow-up. It was a pleasure meeting you in clinic today!   Sigurd Sos, MD [facility]

## 2022-01-23 NOTE — Progress Notes (Signed)
FOLLOW UP Date of Service/Encounter:  01/25/22   Subjective:  Mark Friedman. (DOB: 1971-08-09) is a 51 y.o. male who [facility] in re-evaluation of the following: asthma and chronic rhinitis History obtained from: chart review and patient.  For Review, LV was on 12/25/21  with Dr.Zaakirah Kistner.    At that visit, his asthma seems uncontrolled so we reinforced use of Flovent 110, 2 puffs twice a day.  We held off on allergy testing for environmental allergens as he wanted to speak first with insurance.  We did have him start Mucinex for history of drainage and holding on nasal sprays per her preference.  He reported a history of remote anaphylaxis of unknown cause, but did not seem to be avoiding any particular foods.  Today he presents for follow-up to update his allergy testing.  Since starting the Flovent 110, 2 puffs twice a day he reports his wheezing has significantly improved.  He is no longer wheezing.  He has only used rescue inhaler 3 or 4 times since last visit which he was previously using multiple times per day prior to starting Flovent daily.   He was told he was allergic to wheat and dairy when he was younger, but outgrew this when he moved to [place].   He did go on a smoothie diet/fasting, and didn't eat any meats and he lost a significant amount of weight, but then had rebound weight gain.  He is not avoiding any specific foods. No specific food concerns, just would like to update his testing since it has been positive in the past.  Pertinent History/Diagnostics:  - Asthma: - diagnosed in childhood, hospitalized multiple times as a child,  -Identified Triggers:  Allergens and irritants and respiratory illness  - pre/post spirometry (12/25/21): ratio 110%, 84% FEV1 (pre), + 9% (92%) FEV1 (post), + 19% increase in FVC - Allergic Rhinitis:   -blood drawl 2018-grasses negative, molds positive for Aspergillus and Candida , animal panel positive  for mouse, nut panel negative Was on allergy shots, stopped 20 years ago (1998), did these when he lived in [place]-they were helpful to him  Allergies as of 01/25/2022   No Known Allergies      Medication List        Accurate as of January 25, 2022  5:01 PM. If you have any questions, ask your nurse or doctor.          albuterol 108 (90 Base) MCG/ACT inhaler Commonly known as: VENTOLIN HFA Inhale 2 puffs into the lungs every 4 (four) hours as needed for wheezing or shortness of breath.   EPINEPHrine 0.3 mg/0.3 mL Soaj injection Commonly known as: EpiPen 2-Pak Inject 0.3 mg into the muscle once for 1 dose. Started by: Tonny Bollman, MD   fluticasone 110 MCG/ACT inhaler Commonly known as: Flovent HFA Take 2 puffs twice daily EVERYDAY. Rinse mouth out after use.   levocetirizine 5 MG tablet Commonly known as: XYZAL TAKE 1 TABLET BY MOUTH EVERY DAY IN THE EVENING       Past Medical History:  Diagnosis Date   Asthma    Chronic low back pain    Environmental allergies    Past Surgical History:  Procedure Laterality Date   NO PAST SURGERIES     Otherwise, there have been no changes to his past medical history, surgical history, family history, or social history.  ROS: All others negative except as noted per HPI.   Objective:  BP 134/82    Pulse 80    Temp 97.9 F (36.6 C) (Temporal)    Resp 19    Ht 5' 9.75" (1.772 m)    Wt 293 lb 14.4 oz (133.3 kg)    SpO2 97%    BMI 42.47 kg/m  Body mass index is 42.47 kg/m. Physical Exam: General Appearance:  Alert, cooperative, no distress, appears stated age  Head:  Normocephalic, without obvious abnormality, atraumatic  Eyes:  Conjunctiva clear, EOM's intact  Nose: Nares normal, hypertrophic turbinates, normal mucosa, no visible anterior polyps, and septum midline  Throat: Lips, tongue normal; teeth and gums normal, normal posterior oropharynx  Neck: Supple, symmetrical  Lungs:   clear to auscultation bilaterally,  Respirations unlabored, no coughing  Heart:  regular rate and rhythm and no murmur, Appears well perfused  Extremities: No edema  Skin: Skin color, texture, turgor normal, no rashes or lesions on visualized portions of skin, + dermatographism  Neurologic: No gross deficits   Spirometry:  Tracings reviewed. His effort: Good reproducible efforts. FVC: 4.33L FEV1: 3.33L, 89% predicted FEV1/FVC ratio: 96% Interpretation: Spirometry consistent with normal pattern.  Please see scanned spirometry results for details.  Skin Testing: Environmental allergy panel and select foods. Positive test to: dust mites, cat, mouse, horse. Negative test to: top 9 food allergens, other environmental.  Results discussed with patient/family.  Airborne Adult Perc - 01/25/22 1531     Time Antigen Placed 1531    Allergen Manufacturer Waynette Buttery    Location Back    Number of Test 59    Panel 1 Select    1. Control-Buffer 50% Glycerol Negative    2. Control-Histamine 1 mg/ml 3+    3. Albumin saline Negative    4. Bahia Negative    5. French Southern Territories Negative    6. Johnson Negative    7. Kentucky Blue Negative    8. Meadow Fescue Negative    9. Perennial Rye Negative    10. Sweet Vernal Negative    11. Timothy Negative    12. Cocklebur Negative    13. Burweed Marshelder Negative    14. Ragweed, short Negative    15. Ragweed, Giant Negative    16. Plantain,  English Negative    17. Lamb's Quarters Negative    18. Sheep Sorrell Negative    19. Rough Pigweed Negative    20. Marsh Elder, Rough Negative    21. Mugwort, Common Negative    22. Ash mix Negative    23. Birch mix Negative    24. Beech American Negative    25. Box, Elder Negative    26. Cedar, red Negative    27. Cottonwood, Guinea-Bissau Negative    28. Elm mix Negative    29. Hickory Negative    30. Maple mix Negative    31. Oak, Guinea-Bissau mix Negative    32. Pecan Pollen Negative    33. Pine mix Negative    34. Sycamore Eastern Negative    35. Walnut,  Black Pollen Negative    36. Alternaria alternata Negative    37. Cladosporium Herbarum Negative    38. Aspergillus mix Negative    39. Penicillium mix Negative    40. Bipolaris sorokiniana (Helminthosporium) Negative    41. Drechslera spicifera (Curvularia) Negative    42. Mucor plumbeus Negative    43. Fusarium moniliforme Negative    44. Aureobasidium pullulans (pullulara) Negative    45. Rhizopus oryzae Negative    46. Botrytis cinera Negative  47. Epicoccum nigrum Negative    48. Phoma betae Negative    49. Candida Albicans Negative    50. Trichophyton mentagrophytes Negative    51. Mite, D Farinae  5,000 AU/ml 4+    52. Mite, D Pteronyssinus  5,000 AU/ml 4+    53. Cat Hair 10,000 BAU/ml 3+    54.  Dog Epithelia Negative    55. Mixed Feathers Negative    56. Horse Epithelia 3+    57. Cockroach, German Negative    58. Mouse 3+    59. Tobacco Leaf Negative             Food Perc - 01/25/22 1531       Test Information   Time Antigen Placed 1531    Allergen Manufacturer Waynette Buttery    Location Back    Number of allergen test 10      Food   1. Peanut Negative    2. Soybean food Negative    3. Wheat, whole Negative    4. Sesame Negative    5. Milk, cow Negative    6. Egg White, chicken Negative    7. Casein Negative    8. Shellfish mix Negative    9. Fish mix Negative    10. Cashew Negative             Allergy testing results were read and interpreted by myself, documented by clinical staff.  Assessment/Plan  Asthma significantly improved since starting Flovent 110 as prescribed. Rhinitis determined to be perennial based on today's testing.  However testing somewhat difficult to interpret due to significant dermatographism. We will also obtain blood testing to confirm as he is interested in allergy shots.  Moderate Persistent Asthma-controlled -Your breathing test looked excellent today - Controller Inhaler: Continue Flovent 110, 2 puffs twice a day; This  Should Be Used Everyday - Rinse mouth out after use - Rescue Inhaler: Albuterol (Proair/Ventolin) 2 puffs . Use  every 4-6 hours as needed for chest tightness, wheezing, or coughing.  Can also use 15 minutes prior to exercise if you have symptoms with activity. - Asthma is not controlled if:  - Symptoms are occurring >2 times a week OR  - >2 times a month nighttime awakenings  - You are requiring systemic steroids (prednisone/steroid injections) more than once per year  - Your require hospitalization for your asthma.  - Please call the clinic to schedule a follow up if these symptoms arise  Chronic Rhinitis - uncontrolled: - allergy testing today showed definite positives to dust mites, cat, horse, mouse; difficult to read due to dermatographia - allergen avoidance - will avoid nasal sprays for now - Continue over the counter antihistamine daily or daily as needed.   Use a NON-DROWSY option so that you can take the full amount. -Your options include Zyrtec (Cetirizine) 10mg , Claritin (Loratadine) 10mg , Allegra (Fexofenadine) 180mg , or Xyzal (Levocetirinze) 5mg  - Continue Mucinex 600 mg twice a day as needed, drink plenty of water - allergy injections-will obtain labs to ensure we are including everything in your allergy injections - Epipen prescription has been sent to your pharmacy which you will need to bring to your allergy injection appointments  History of allergic reaction, unknown cause:  -Food allergy testing today was negative -Please let know if this recurs  It was a pleasure seeing you again in clinic today!  , MD  [facility]

## 2022-01-25 ENCOUNTER — Encounter: Payer: Self-pay | Admitting: Internal Medicine

## 2022-01-25 ENCOUNTER — Ambulatory Visit: Payer: BC Managed Care – PPO | Admitting: Internal Medicine

## 2022-01-25 ENCOUNTER — Other Ambulatory Visit: Payer: Self-pay

## 2022-01-25 VITALS — BP 134/82 | HR 80 | Temp 97.9°F | Resp 19 | Ht 69.75 in | Wt 293.9 lb

## 2022-01-25 DIAGNOSIS — J310 Chronic rhinitis: Secondary | ICD-10-CM

## 2022-01-25 DIAGNOSIS — J4540 Moderate persistent asthma, uncomplicated: Secondary | ICD-10-CM | POA: Diagnosis not present

## 2022-01-25 DIAGNOSIS — T781XXA Other adverse food reactions, not elsewhere classified, initial encounter: Secondary | ICD-10-CM

## 2022-01-25 DIAGNOSIS — J3089 Other allergic rhinitis: Secondary | ICD-10-CM

## 2022-01-25 DIAGNOSIS — Z9109 Other allergy status, other than to drugs and biological substances: Secondary | ICD-10-CM

## 2022-01-25 MED ORDER — FLUTICASONE PROPIONATE HFA 110 MCG/ACT IN AERO: INHALATION_SPRAY | RESPIRATORY_TRACT | 3 refills | Status: DC

## 2022-01-25 MED ORDER — LEVOCETIRIZINE DIHYDROCHLORIDE 5 MG PO TABS: ORAL_TABLET | ORAL | 2 refills | Status: DC

## 2022-01-25 MED ORDER — EPINEPHRINE 0.3 MG/0.3ML IJ SOAJ: 0.3000 mg | Freq: Once | INTRAMUSCULAR | 2 refills | Status: AC

## 2022-01-25 NOTE — Patient Instructions (Signed)
Moderate Persistent Asthma-controlled ?-Your breathing test looked excellent today ?- Controller Inhaler: Continue Flovent 110, 2 puffs twice a day; This Should Be Used Everyday ?- Rinse mouth out after use ?- Rescue Inhaler: Albuterol (Proair/Ventolin) 2 puffs . Use  every 4-6 hours as needed for chest tightness, wheezing, or coughing.  Can also use 15 minutes prior to exercise if you have symptoms with activity. ?- Asthma is not controlled if: ? - Symptoms are occurring >2 times a week OR ? - >2 times a month nighttime awakenings ? - You are requiring systemic steroids (prednisone/steroid injections) more than once per year ? - Your require hospitalization for your asthma. ? - Please call the clinic to schedule a follow up if these symptoms arise ? ?Chronic Rhinitis - uncontrolled: ?- allergy testing today showed definite positives to dust mites, cat, horse, mouse; difficult to read due to dermatographia ?- allergen avoidance ?- will avoid nasal sprays for now ?- Continue over the counter antihistamine daily or daily as needed.   ?Use a NON-DROWSY option so that you can take the full amount. ?-Your options include Zyrtec (Cetirizine) 10mg , Claritin (Loratadine) 10mg , Allegra (Fexofenadine) 180mg , or Xyzal (Levocetirinze) 5mg  ?- Continue Mucinex 600 mg twice a day as needed, drink plenty of water ?- allergy injections-will obtain labs to ensure we are including everything in your allergy injections ?- Epipen prescription has been sent to your pharmacy which you will need to bring to your allergy injection appointments ? ?History of allergic reaction, unknown cause:  ?-Food allergy testing today was negative ?-Please let know if this recurs ? ?It was a pleasure seeing you again in clinic today! ? ? ? , MD ?[facility] ? ? ? ? ?

## 2022-01-29 ENCOUNTER — Telehealth: Payer: Self-pay | Admitting: Internal Medicine

## 2022-01-29 NOTE — Telephone Encounter (Signed)
Spoke with pt new order for [facility] has been ordered. ? ?He also wanted [place] cancel the levocetirizine and albuterol as he still has some at home. Also spoke with pharmacy to put this on hold and made sure his Flovent was 90 day supply as it is cheaper. The pharmacist was able to put the levocetirizne and the albuterol on hold until pt needs it and Flovent is a 90 day supply. Tried calling pt and pts wife so left message for them to call back. [facility] lab order will be at the front desk for him to pick up.  ?

## 2022-01-29 NOTE — Telephone Encounter (Signed)
Patient states Dr. Maurine Minister requested lab work he was given orders for [facility] but patient states his insurance will only pay for [facility] asking for this order to be resent to [facility] on [address] in [place] Please advise  ?

## 2022-01-29 NOTE — Addendum Note (Signed)
Addended by: Felipa Emory on: 01/29/2022 03:53 PM ? ? Modules accepted: Orders ? ?

## 2022-01-31 LAB — RESPIRATORY ALLERGY PROFILE REGION II ~~LOC~~
Allergen, A. alternata, m6: <0.1 kU/L
Allergen, Cedar tree, t12: <0.1 kU/L
Allergen, Comm Silver Birch, t9: <0.1 kU/L
Allergen, Cottonwood, t14: <0.1 kU/L
Allergen, D pternoyssinus,d7: 16.4 kU/L — ABNORMAL HIGH
Allergen, Mouse Urine Protein, e78: 1.7 kU/L — ABNORMAL HIGH
Allergen, Mulberry, t76: <0.1 kU/L
Allergen, Oak,t7: <0.1 kU/L
Allergen, P. notatum, m1: 0.42 kU/L — ABNORMAL HIGH
Aspergillus fumigatus, m3: 0.16 kU/L — ABNORMAL HIGH
Bermuda Grass: <0.1 kU/L
Box Elder IgE: <0.1 kU/L
CLADOSPORIUM HERBARUM (M2) IGE: <0.1 kU/L
COMMON RAGWEED (SHORT) (W1) IGE: <0.1 kU/L
Cat Dander: 0.48 kU/L — ABNORMAL HIGH
Class: 0
Class: 0
Class: 0
Class: 0
Class: 0
Class: 0
Class: 0
Class: 0
Class: 0
Class: 0
Class: 0
Class: 0
Class: 0
Class: 0
Class: 0
Class: 0
Class: 1
Class: 1
Class: 2
Class: 3
Class: 3
Class: 4
Cockroach: <0.1 kU/L
D. farinae: 18.8 kU/L — ABNORMAL HIGH
Dog Dander: 4.1 kU/L — ABNORMAL HIGH
Elm IgE: 0.11 kU/L — ABNORMAL HIGH
IgE (Immunoglobulin E), Serum: 248 kU/L — ABNORMAL HIGH (ref <=114)
Johnson Grass: <0.1 kU/L
Pecan/Hickory Tree IgE: <0.1 kU/L
Rough Pigweed  IgE: <0.1 kU/L
Sheep Sorrel IgE: <0.1 kU/L
Timothy Grass: <0.1 kU/L

## 2022-01-31 LAB — INTERPRETATION:

## 2022-01-31 NOTE — Progress Notes (Signed)
Please let Mark Friedman know that his allergy blood work has returned.  It was similar to his skin testing except that dog was positive as well as a few molds. ?Dust mites, cat, and mouse also showed up.  I would include all of this and his allergy shots.  Please find out if he would like to start allergy injections.  He was going to call insurance to make sure it is covered.  He is not a candidate for [employer].

## 2022-02-01 ENCOUNTER — Telehealth: Payer: Self-pay | Admitting: Internal Medicine

## 2022-02-01 NOTE — Telephone Encounter (Signed)
Pt was called to relate lab result, unable to reach pt at both contacts on file, left msg to call office. ?

## 2022-02-01 NOTE — Telephone Encounter (Signed)
Pt requesting a call back about lab results ?

## 2022-02-06 ENCOUNTER — Encounter: Payer: Self-pay | Admitting: *Deleted

## 2022-02-06 NOTE — Telephone Encounter (Signed)
Refer to result note in labs.  ?

## 2022-04-26 NOTE — Progress Notes (Signed)
FOLLOW UP Date of Service/Encounter:  04/27/22   Subjective:  Mark Friedman. (DOB: [birth year]) is a 51 y.o. male who [facility] in re-evaluation of the following: asthma, allergic rhinitis History obtained from: chart review and patient  For Review, LV was on 01/25/2022 with Dr.Mariadelaluz Guggenheim seen for routine follow-up to update his allergy testing.  At his initial visit we had started him on Flovent 110, 2 puffs twice daily for wheezing which he reported significantly improved at this follow-up.  FEV1 89% at last visit.  We discussed allergy injections which she was interested in.  Today presents for follow-up. Asthma: He is using rescue inhaler around a couple times of month. This is a significant improvement form his initial visit. He takes his flovent 110, 2 puffs bid.  He received a letter from his insurance that this will no longer be covered, but pulmicort flexhaler will.  He did not pick up his last Flovent 110 for the month and would like to make the switch now.    Allergic rhinitis: He is still interested in allergy injections, but is not sure he is disciplined enough to get over here.  He feels like most of his allergies come from being indoors.  He is curious if a HEPA filter would be covered by his insurance because he feels it would be very beneficial for his indoor symptoms, agree that it would.   He has a lot of concerns regarding allergy injections because of all the misinformation on the dark web regarding COVID-19 vaccines.  Discussed that these are not the same as vaccines and encouraged him to work through these concerns prior to starting allergy injections.  I tried to educate him regarding benefits and discuss any concerns he might have.   ------------------------------------------------------------------------------------------------- Pertinent History/Diagnostics:  - Asthma: - diagnosed in childhood, hospitalized multiple times as a  child,  -Identified Triggers:  Allergens and irritants and respiratory illness                - pre/post spirometry (12/25/21): ratio 110%, 84% FEV1 (pre), + 9% (92%) FEV1 (post), + 19% increase in FVC - Allergic Rhinitis:                 -blood drawl 2018-grasses negative, molds positive for Aspergillus and Candida , animal panel positive for mouse, nut panel negative Was on allergy shots, stopped 20 years ago (1998), did these when he lived in [place]-they were helpful to him  -01/25/2022: SPT positive to dust mites, cat, horse, mouse -History of previous positive food allergy test in youth, not avoiding any specific foods.  SPT negative to most common food allergens for  -Blood drawl on 01/30/2022-positive to dust mites, molds, cat, dog mouse.  Total IgE 248  Allergies as of 04/27/2022   No Known Allergies      Medication List        Accurate as of April 27, 2022  9:11 AM. If you have any questions, ask your nurse or doctor.          albuterol 108 (90 Base) MCG/ACT inhaler Commonly known as: VENTOLIN HFA Inhale 2 puffs into the lungs every 4 (four) hours as needed for wheezing or shortness of breath.   fluticasone 110 MCG/ACT inhaler Commonly known as: Flovent HFA Take 2 puffs twice daily EVERYDAY. Rinse mouth out after use.   levocetirizine 5 MG tablet Commonly known as: XYZAL TAKE 1 TABLET BY MOUTH EVERY DAY IN THE  EVENING       Past Medical History:  Diagnosis Date   Asthma    Chronic low back pain    Environmental allergies    Past Surgical History:  Procedure Laterality Date   NO PAST SURGERIES     Otherwise, there have been no changes to his past medical history, surgical history, family history, or social history.  ROS: All others negative except as noted per HPI.   Objective:  BP 128/90   Pulse 64   Temp 97.7 F (36.5 C) (Temporal)   Resp 18   SpO2 96%  There is no height or weight on file to calculate BMI. Physical Exam: General Appearance:   Alert, cooperative, no distress, appears stated age  Head:  Normocephalic, without obvious abnormality, atraumatic  Eyes:  Conjunctiva clear, EOM's intact  Nose: Nares normal, normal mucosa and no visible anterior polyps  Ears Normal Tms bilaterally, normal amount of cerumen, normal EACs  Throat: Lips, tongue normal; teeth and gums normal, normal posterior oropharynx  Neck: Supple, symmetrical  Lungs:   clear to auscultation bilaterally, Respirations unlabored, no coughing  Heart:  regular rate and rhythm and no murmur, Appears well perfused  Extremities: No edema  Skin: Skin color, texture, turgor normal, no rashes or lesions on visualized portions of skin  Neurologic: No gross deficits   Spirometry:  Tracings reviewed. His effort: Good reproducible efforts. FVC: 4.34L FEV1: 3.69L, 99% predicted FEV1/FVC ratio: 105% Interpretation: Spirometry consistent with normal pattern.  Please see scanned spirometry results for details.  Assessment/Plan   Moderate Persistent Asthma-controlled -Your breathing test looked great! - Controller Inhaler: Switch to Pulmicort flexhaler 180 mcg, 1 puff twice a day This Should Be Used Everyday-use in place of Flovent (per insurance request) - Rinse mouth out after use - Rescue Inhaler: Albuterol (Proair/Ventolin) 2 puffs . Use  every 4-6 hours as needed for chest tightness, wheezing, or coughing.  Can also use 15 minutes prior to exercise if you have symptoms with activity. - Asthma is not controlled if:  - Symptoms are occurring >2 times a week OR  - >2 times a month nighttime awakenings  - You are requiring systemic steroids (prednisone/steroid injections) more than once per year  - Your require hospitalization for your asthma.  - Please call the clinic to schedule a follow up if these symptoms arise  Allergic rhinitis: partially controlled - allergy avoidance toward dust mites, cat, horse, mouse, molds - will avoid nasal sprays for now - Continue  over the counter antihistamine daily or daily as needed.   Use a NON-DROWSY option so that you can take the full amount. -Your options include Zyrtec (Cetirizine) 10mg , Claritin (Loratadine) 10mg , Allegra (Fexofenadine) 180mg , or Xyzal (Levocetirinze) 5mg  - Continue Mucinex 600 mg twice a day as needed, drink plenty of water - allergy injections-this will teach your immune system to be tolerant of what you allergic to, if you decide to do this, call for scheduling - HEPA device - will try to order an air filter for your home, if not covered, you do not have to buy this, but it would help with your indoor allergens  History of allergic reaction, unknown cause:  -Food allergy testing today was negative -Please let know if this recurs  Concerns about hearing:  - patient mentioned concerns about decreased hearing during exam, normal ear exam - talk to your PCP about this issue  It was a pleasure seeing you again in clinic today! Follow-up in 6  months, sooner if needed.   Tonny BollmanErin Kairee Isa, MD  [facility] [place]

## 2022-04-27 ENCOUNTER — Encounter: Payer: Self-pay | Admitting: Internal Medicine

## 2022-04-27 ENCOUNTER — Ambulatory Visit: Payer: BC Managed Care – PPO | Admitting: Internal Medicine

## 2022-04-27 DIAGNOSIS — Z9109 Other allergy status, other than to drugs and biological substances: Secondary | ICD-10-CM

## 2022-04-27 DIAGNOSIS — J4540 Moderate persistent asthma, uncomplicated: Secondary | ICD-10-CM

## 2022-04-27 MED ORDER — LEVOCETIRIZINE DIHYDROCHLORIDE 5 MG PO TABS: ORAL_TABLET | ORAL | 2 refills | Status: DC

## 2022-04-27 MED ORDER — PULMICORT FLEXHALER 180 MCG/ACT IN AEPB
1.0000 | INHALATION_SPRAY | Freq: Two times a day (BID) | RESPIRATORY_TRACT | 4 refills | Status: DC
Start: 2022-04-27 — End: 2022-04-27

## 2022-04-27 MED ORDER — VICKS AIR PURIFIER/HEPA (DEVICE) MISC: 1.0000 | Freq: Every day | 0 refills | Status: DC

## 2022-04-27 MED ORDER — VICKS AIR PURIFIER/HEPA (DEVICE) MISC
1.0000 | Freq: Every day | 0 refills | Status: AC
Start: 2022-04-27 — End: ?

## 2022-04-27 MED ORDER — PULMICORT FLEXHALER 180 MCG/ACT IN AEPB
1.0000 | INHALATION_SPRAY | Freq: Two times a day (BID) | RESPIRATORY_TRACT | 1 refills | Status: DC
Start: 2022-04-27 — End: 2022-10-19

## 2022-04-27 NOTE — Patient Instructions (Addendum)
Moderate Persistent Asthma-controlled -Your breathing test looked great! - Controller Inhaler: Switch to Pulmicort flexhaler 180 mcg, 1 puff twice a day This Should Be Used Everyday-use in place of Flovent (per insurance request) - Rinse mouth out after use - Rescue Inhaler: Albuterol (Proair/Ventolin) 2 puffs . Use  every 4-6 hours as needed for chest tightness, wheezing, or coughing.  Can also use 15 minutes prior to exercise if you have symptoms with activity. - Asthma is not controlled if:  - Symptoms are occurring >2 times a week OR  - >2 times a month nighttime awakenings  - You are requiring systemic steroids (prednisone/steroid injections) more than once per year  - Your require hospitalization for your asthma.  - Please call the clinic to schedule a follow up if these symptoms arise  Allergic rhinitis: - allergy avoidance toward dust mites, cat, horse, mouse, molds - will avoid nasal sprays for now - Continue over the counter antihistamine daily or daily as needed.   Use a NON-DROWSY option so that you can take the full amount. -Your options include Zyrtec (Cetirizine) 10mg , Claritin (Loratadine) 10mg , Allegra (Fexofenadine) 180mg , or Xyzal (Levocetirinze) 5mg  - Continue Mucinex 600 mg twice a day as needed, drink plenty of water - allergy injections-this will teach your immune system to be tolerant of what you allergic to, if you decide to do this, call for scheduling - HEPA device - will try to order an air filter for your home, if not covered, you do not have to buy this, but it would help with your indoor allergens  History of allergic reaction, unknown cause:  -Food allergy testing today was negative -Please let know if this recurs  Concerns about hearing:  - patient mentioned concerns about decreased hearing during exam, normal ear exam - talk to your PCP about this issue  It was a pleasure seeing you again in clinic today! Follow-up in 6 months, sooner if needed.    DUST MITE AVOIDANCE MEASURES:  There are three main measures that need and can be taken to avoid house dust mites:  Reduce accumulation of dust in general -reduce furniture, clothing, carpeting, books, stuffed animals, especially in bedroom  Separate yourself from the dust -use pillow and mattress encasements (can be found at stores such as [employer] or online) -avoid direct exposure to air condition flow -use a HEPA filter device, especially in the bedroom; you can also use a HEPA filter vacuum cleaner -wipe dust with a moist towel instead of a dry towel or broom when cleaning  Decrease mites and/or their secretions -wash clothing and linen and stuffed animals at highest temperature possible, at least every 2 weeks -stuffed animals can also be placed in a bag and put in a freezer overnight  Despite the above measures, it is impossible to eliminate dust mites or their allergen completely from your home.  With the above measures the burden of mites in your home can be diminished, with the goal of minimizing your allergic symptoms.  Success will be reached only when implementing and using all means together.  Control of Mold Allergen   Mold and fungi can grow on a variety of surfaces provided certain temperature and moisture conditions exist.  Outdoor molds grow on plants, decaying vegetation and soil.  The major outdoor mold, Alternaria and Cladosporium, are found in very high numbers during hot and dry conditions.  Generally, a late Summer - Fall peak is seen for common outdoor fungal spores.  Rain will temporarily lower outdoor mold spore count, but counts rise rapidly when the rainy period ends.  The most important indoor molds are Aspergillus and Penicillium.  Dark, humid and poorly ventilated basements are ideal sites for mold growth.  The next most common sites of mold growth are the bathroom and the kitchen.  Outdoor (Seasonal) Mold Control  Use air conditioning and  keep windows closed Avoid exposure to decaying vegetation. Avoid leaf raking. Avoid grain handling. Consider wearing a face mask if working in moldy areas.    Indoor (Perennial) Mold Control   Maintain humidity below 50%. Clean washable surfaces with 5% bleach solution. Remove sources e.g. contaminated carpets.   Control of Dog or Cat Allergen  Avoidance is the best way to manage a dog or cat allergy. If you have a dog or cat and are allergic to dog or cats, consider removing the dog or cat from the home. If you have a dog or cat but don't want to find it a new home, or if your family wants a pet even though someone in the household is allergic, here are some strategies that may help keep symptoms at bay:  Keep the pet out of your bedroom and restrict it to only a few rooms. Be advised that keeping the dog or cat in only one room will not limit the allergens to that room. Don't pet, hug or kiss the dog or cat; if you do, wash your hands with soap and water. High-efficiency particulate air (HEPA) cleaners run continuously in a bedroom or living room can reduce allergen levels over time. Regular use of a high-efficiency vacuum cleaner or a central vacuum can reduce allergen levels. Giving your dog or cat a bath at least once a week can reduce airborne allergen.   Mark Bollman, MD [facility]

## 2022-04-27 NOTE — Addendum Note (Signed)
Addended by: Berna Bue on: 04/27/2022 04:08 PM   Modules accepted: Orders

## 2022-07-16 ENCOUNTER — Ambulatory Visit: Payer: BC Managed Care – PPO | Admitting: Family Medicine

## 2022-07-18 ENCOUNTER — Ambulatory Visit: Payer: BC Managed Care – PPO | Admitting: Family Medicine

## 2022-07-18 ENCOUNTER — Encounter: Payer: Self-pay | Admitting: Family Medicine

## 2022-07-18 VITALS — BP 168/94 | Ht 69.0 in | Wt 275.0 lb

## 2022-07-18 DIAGNOSIS — M533 Sacrococcygeal disorders, not elsewhere classified: Secondary | ICD-10-CM

## 2022-07-18 DIAGNOSIS — M25561 Pain in right knee: Secondary | ICD-10-CM | POA: Diagnosis not present

## 2022-07-18 NOTE — Progress Notes (Signed)
PCP: Sharlene Dory, DO  Subjective:   HPI: Patient is a 51 y.o. male here for left knee pain and back pain.  Left knee pain -Started 2-6 months ago -Was working outside doing yard work for a friend, kneeling down with knee pads and then noticed the pain afterward -Says he sometimes has clicking, popping of the knee -No falls or trauma that he can recall -Wonders if he has fluid buildup in the joint and needs an aspiration -Feels his range of motion is decreased with flexion, can fully extend -Sometimes gets numbness over anterior thigh and into knee that radiates from the back  Left-sided back pain, chronic -Ongoing for years, worse on the left side -No red flag symptoms -Does a lot of heavy lifting at work for [employer] -Completes home exercises  Past Medical History:  Diagnosis Date   Asthma    Chronic low back pain    Environmental allergies     Current Outpatient Medications on File Prior to Visit  Medication Sig Dispense Refill   Air Cleaners (VICKS AIR PURIFIER/HEPA) (Device) MISC 1 each by Does not apply route daily. 1 each 0   albuterol (VENTOLIN HFA) 108 (90 Base) MCG/ACT inhaler Inhale 2 puffs into the lungs every 4 (four) hours as needed for wheezing or shortness of breath. 18 g 1   budesonide (PULMICORT FLEXHALER) 180 MCG/ACT inhaler Inhale 1 puff into the lungs in the morning and at bedtime. 3 each 1   levocetirizine (XYZAL) 5 MG tablet TAKE 1 TABLET BY MOUTH EVERY DAY IN THE EVENING 90 tablet 2   No current facility-administered medications on file prior to visit.    Past Surgical History:  Procedure Laterality Date   NO PAST SURGERIES      No Known Allergies  There were no vitals taken for this visit.      No data to display              No data to display              Objective:  Physical Exam:  Gen: NAD, comfortable in exam room  Left knee: No TTP to palpation.  Inspection was negative for erythema, ecchymosis, and obvious  effusion. No obvious bony abnormalities or signs of osteophyte development. Palpation yielded no asymmetric warmth; No joint line tenderness; No condyle tenderness; No patellar tenderness; Mild knee crepitus. Patellar and quadriceps tendons unremarkable. No obvious Baker's cyst development. ROM normal in flexion (135 degrees) and extension (0 degrees). Strength 5/5 with knee flexion and extension. Neurovascularly intact bilaterally.   Provocative Testing: - Cruciate Ligaments:   - Anterior Drawer/Lachman test: NEG - Posterior Drawer: NEG  - Collateral Ligaments:   - Varus/Valgus (MCL/LCL) Stress test at 0, 15d: NEG  - Meniscus:   - Thessaly: Positive (but also has pain in multiple locations with this test) Back/Hip: Tenderness to palpation at left SI joint. No swelling/bruising.  Pain with Pearlean Brownie, negative fadir. Negative SLR.    Assessment & Plan:  1. Left knee pain, likely meniscal tear vs. OA Given young age, fluid seen on ultrasound and constellation of symptoms, favor meniscal tear but may have concomitant diagnoses. No signs or symptoms of ligamental injury, and no concern for infectious etiology. Will start formal PT and encouraged home exercises on days not in therapy Voltaren gel QID, icing 3-4x/day and wear knee sleeve for compression with walking F/U in 5-6 weeks  2. SI joint dysfunction, left Chronic. Not significantly worsened. Will work with PT  on this as well.

## 2022-07-18 NOTE — Patient Instructions (Signed)
You have SI joint dysfunction and I'm concerned you have a meniscus tear in your knee. Start physical therapy and do home exercises on days you don't go to therapy. Voltaren gel topically up to 4 times a day. Boswellia extract may help for arthritis pain. Icing 15 minutes at a time to the knee 3-4 times a day. Knee sleeve when up and walking around to help with compression. Follow up with me in 5-6 weeks.

## 2022-08-02 ENCOUNTER — Ambulatory Visit: Payer: BC Managed Care – PPO | Attending: Family Medicine | Admitting: Physical Therapy

## 2022-08-02 DIAGNOSIS — M6281 Muscle weakness (generalized): Secondary | ICD-10-CM | POA: Insufficient documentation

## 2022-08-02 DIAGNOSIS — R278 Other lack of coordination: Secondary | ICD-10-CM | POA: Insufficient documentation

## 2022-08-02 DIAGNOSIS — M25552 Pain in left hip: Secondary | ICD-10-CM | POA: Diagnosis present

## 2022-08-02 DIAGNOSIS — M25561 Pain in right knee: Secondary | ICD-10-CM | POA: Diagnosis not present

## 2022-08-02 DIAGNOSIS — R2689 Other abnormalities of gait and mobility: Secondary | ICD-10-CM | POA: Diagnosis present

## 2022-08-02 DIAGNOSIS — M25562 Pain in left knee: Secondary | ICD-10-CM | POA: Diagnosis present

## 2022-08-02 DIAGNOSIS — R262 Difficulty in walking, not elsewhere classified: Secondary | ICD-10-CM | POA: Diagnosis present

## 2022-08-02 DIAGNOSIS — G8929 Other chronic pain: Secondary | ICD-10-CM | POA: Diagnosis present

## 2022-08-02 NOTE — Therapy (Signed)
OUTPATIENT PHYSICAL THERAPY LOWER EXTREMITY EVALUATION   Patient Name: Mark Friedman. MRN: [id number] DOB:[birth year], 51 y.o., male Today's Date: 08/02/2022   PT End of Session - 08/02/22 1007     Visit Number 1    Date for PT Re-Evaluation 10/11/22    PT Start Time 0848    PT Stop Time 0934    PT Time Calculation (min) 46 min    Activity Tolerance Patient tolerated treatment well;Patient limited by pain    Behavior During Therapy [facility] for tasks assessed/performed             Past Medical History:  Diagnosis Date   Asthma    Chronic low back pain    Environmental allergies    Past Surgical History:  Procedure Laterality Date   NO PAST SURGERIES     Patient Active Problem List   Diagnosis Date Noted   Mild intermittent asthma without complication [id number]   Plantar fasciitis of right foot 10/24/2017   Environmental allergies 09/16/2017   Chronic left-sided low back pain without sciatica 08/15/2017   BMI 40.0-44.9, adult ([place]) 06/24/2012   Asthma 06/24/2012   Perennial allergic rhinitis 06/24/2012    PCP: Riki Sheer  REFERRING PROVIDER: Dene Gentry, MD   REFERRING DIAG: Diagnosis M25.561 (ICD-10-CM) - Right knee pain, unspecified chronicity   THERAPY DIAG:  Acute pain of left knee  Chronic left hip pain  Muscle weakness (generalized)  Difficulty in walking, not elsewhere classified  Other lack of coordination  Other abnormalities of gait and mobility  Rationale for Evaluation and Treatment Rehabilitation  ONSET DATE: 07/18/2022   SUBJECTIVE:   SUBJECTIVE STATEMENT: Patient reports that he injured his knee approximately 2 months ago. He was told that he tore his meniscus. The swelling and pain have subsided somewhat. 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 Left knee: No TTP to palpation.  Inspection was negative for erythema,  ecchymosis, and obvious effusion. No obvious bony abnormalities or signs of osteophyte development. Palpation yielded no asymmetric warmth; No joint line tenderness; No condyle tenderness; No patellar tenderness; Mild knee crepitus. Patellar and quadriceps tendons unremarkable. No obvious Baker's cyst development. ROM normal in flexion (135 degrees) and extension (0 degrees). Strength 5/5 with knee flexion and extension. Neurovascularly intact bilaterally.              Provocative Testing: - Cruciate Ligaments:                         - Anterior Drawer/Lachman test: NEG - Posterior Drawer: NEG             - Collateral Ligaments:                         - Varus/Valgus (MCL/LCL) Stress test at 0, 15d: NEG             - Meniscus:                         - Thessaly: Positive (but also has pain in multiple locations with this test) Back/Hip: Tenderness to palpation at left SI joint. No swelling/bruising.  Pain with Corky Sox, negative fadir. Negative SLR.  PAIN:  Are you having pain? Yes: NPRS scale: 2/10 Pain location: L knee Pain description: sore Aggravating factors: bending Relieving factors: rest  PRECAUTIONS: None  WEIGHT BEARING RESTRICTIONS No  FALLS:  Has patient fallen in last 6 months? No  LIVING ENVIRONMENT: Lives with: lives with their family and lives with their spouse Lives in: House/apartment, does have steps at work with rails. 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  MUSCLE LENGTH: Hamstrings: Right 74 deg; Left 41 deg Thomas test: Mildly  tight, L > R  POSTURE: decreased lumbar lordosis  PALPATION: TTP lumbar paraspinals, L > R  LOWER EXTREMITY ROM: R LE ROM Grossly WFL, mildly tight in R hip ER  Passive ROM Right eval Left eval  Hip flexion  80  Hip extension  10  Hip abduction    Hip adduction    Hip internal rotation  WFL  Hip external rotation  Moderately limited  Knee flexion  Mildly limited  Knee extension  WNL  Ankle dorsiflexion    Ankle plantarflexion    Ankle inversion    Ankle eversion     (Blank rows = not tested)  LOWER EXTREMITY MMT: RLE WNL All planes  MMT Right eval Left eval  Hip flexion  3+  Hip extension  3-  Hip abduction  3  Hip adduction    Hip internal rotation    Hip external rotation    Knee flexion  3+  Knee extension  3+  Ankle dorsiflexion  4  Ankle plantarflexion    Ankle inversion    Ankle eversion     (Blank rows = not tested)  LOWER EXTREMITY SPECIAL TESTS:  Per Dr assessment           Provocative Testing: - Cruciate Ligaments:                         - Anterior Drawer/Lachman test: NEG - Posterior Drawer: NEG             - Collateral Ligaments:                         - Varus/Valgus (MCL/LCL) Stress test at 0, 15d: NEG             - Meniscus:                         - Thessaly: Positive (but also has pain in multiple locations with this test) Back/Hip: Tenderness to palpation at left SI joint. No swelling/bruising.  Pain with Pearlean Brownie, negative fadir. Negative SLR. FUNCTIONAL TESTS:  5 times sit to stand: 18.39 Timed up and go (TUG): 11.66  L knee painful during both tests  GAIT: Distance walked: 80 Assistive device utilized: None Level of assistance: Complete Independence Comments: Antalgic gait, slow, increased R lateral sway, increased L swing, decreased L stance    TODAY'S TREATMENT: Education,   PATIENT EDUCATION:  Education details: POC, HEP Person educated: Patient Education method: Programmer, multimedia, Facilities manager, and Handouts Education  comprehension: verbalized understanding   HOME EXERCISE PROGRAM: [phone]  ASSESSMENT:  CLINICAL IMPRESSION: Patient is a 51 y.o. who was seen today  for physical therapy evaluation and treatment for L knee pain with meniscus tear. He also reports chronic LBP due to disc herniation, which causes pain to radiate into L hip. Patient presents with pain and stiffness in L hip and knee, decreased ROM, L>R, weakness in trunk and LEs, L > R. The pain and weakness are causing impaired gait and impeding his functional mobility. He will benefit from PT to address his acute pain as well as improve strength and ROM in trunk and LE's to improve his painfree gait and functional mobility.   OBJECTIVE IMPAIRMENTS Abnormal gait, decreased activity tolerance, decreased coordination, decreased mobility, difficulty walking, decreased ROM, decreased strength, impaired flexibility, improper body mechanics, postural dysfunction, and pain.   ACTIVITY LIMITATIONS bending, standing, squatting, stairs, and locomotion level  PARTICIPATION LIMITATIONS: cleaning, occupation, and yard work  PERSONAL FACTORS Past/current experiences and 1 comorbidity: Lumbar disc herniation  are also affecting patient's functional outcome.   REHAB POTENTIAL: Good  CLINICAL DECISION MAKING: Stable/uncomplicated  EVALUATION COMPLEXITY: Moderate   GOALS: Goals reviewed with patient? Yes  SHORT TERM GOALS: Target date: 08/23/2022  I with basic HEP Baseline: Goal status: INITIAL LONG TERM GOALS: Target date: 10/11/2022   I with final HEP Baseline:  Goal status: INITIAL  2.  Improve 5 x STS to < 12 sec with no C/O pain Baseline: 18.3, painful to L knee Goal status: INITIAL  3.  Patient will demonstrate improved LLE strength by 1 muscle grade in all weak areas. Baseline: 3-(3-)/5 Goal status: INITIAL  4.  Patient will ambulate at least 400' with no deviations, no antalgic gait noted, on level and unlevel surfaces. Baseline:  80', level, mult gait deviations Goal status: INITIAL  5.  Patient will report ability to perform his normal daily activities with pain of < 3/10 in L hip and knee Baseline: up to 6/10 Goal status: INITIAL   PLAN: PT FREQUENCY: 1x/week  PT DURATION: 10 weeks  PLANNED INTERVENTIONS: Therapeutic exercises, Therapeutic activity, Neuromuscular re-education, Balance training, Gait training, Patient/Family education, Self Care, Joint mobilization, Stair training, Dry Needling, Electrical stimulation, Cryotherapy, Moist heat, Vasopneumatic device, Ionotophoresis 4mg /ml Dexamethasone, and Manual therapy  PLAN FOR NEXT SESSION: Update HEP, assess pain and swelling in L knee.   , DPT 08/02/2022, 10:09 AM

## 2022-08-16 ENCOUNTER — Encounter: Payer: Self-pay | Admitting: Physical Therapy

## 2022-08-16 ENCOUNTER — Ambulatory Visit: Payer: BC Managed Care – PPO | Admitting: Physical Therapy

## 2022-08-16 DIAGNOSIS — M25562 Pain in left knee: Secondary | ICD-10-CM

## 2022-08-16 DIAGNOSIS — M6281 Muscle weakness (generalized): Secondary | ICD-10-CM

## 2022-08-16 DIAGNOSIS — G8929 Other chronic pain: Secondary | ICD-10-CM

## 2022-08-16 DIAGNOSIS — R262 Difficulty in walking, not elsewhere classified: Secondary | ICD-10-CM

## 2022-08-16 NOTE — Therapy (Signed)
OUTPATIENT PHYSICAL THERAPY LOWER EXTREMITY EVALUATION   Patient Name: Mark Friedman. MRN: [id number] DOB:[birth year], 51 y.o., male Today's Date: 08/16/2022   PT End of Session - 08/16/22 0845     Visit Number 2    Date for PT Re-Evaluation 10/11/22    PT Start Time 0845    PT Stop Time 0930    PT Time Calculation (min) 45 min    Activity Tolerance Patient tolerated treatment well;Patient limited by pain    Behavior During Therapy [facility] for tasks assessed/performed             Past Medical History:  Diagnosis Date   Asthma    Chronic low back pain    Environmental allergies    Past Surgical History:  Procedure Laterality Date   NO PAST SURGERIES     Patient Active Problem List   Diagnosis Date Noted   Mild intermittent asthma without complication 12/01/2021   Plantar fasciitis of right foot 10/24/2017   Environmental allergies 09/16/2017   Chronic left-sided low back pain without sciatica 08/15/2017   BMI 40.0-44.9, adult ([facility]) 06/24/2012   Asthma 06/24/2012   Perennial allergic rhinitis 06/24/2012    PCP: Arva Chafe  REFERRING PROVIDER: Lenda Kelp, MD   REFERRING DIAG: Diagnosis M25.561 (ICD-10-CM) - Right knee pain, unspecified chronicity   THERAPY DIAG:  Acute pain of left knee  Chronic left hip pain  Muscle weakness (generalized)  Difficulty in walking, not elsewhere classified  Rationale for Evaluation and Treatment Rehabilitation  ONSET DATE: 07/18/2022   SUBJECTIVE:   SUBJECTIVE STATEMENT: Feeling a lot better compared to last time, went on vacation wan not able to do all the exercises.  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 Left knee: No TTP to palpation.  Inspection was negative for erythema, ecchymosis, and obvious effusion. No obvious bony abnormalities or signs of osteophyte development. Palpation yielded no asymmetric warmth; No  joint line tenderness; No condyle tenderness; No patellar tenderness; Mild knee crepitus. Patellar and quadriceps tendons unremarkable. No obvious Baker's cyst development. ROM normal in flexion (135 degrees) and extension (0 degrees). Strength 5/5 with knee flexion and extension. Neurovascularly intact bilaterally.              Provocative Testing: - Cruciate Ligaments:                         - Anterior Drawer/Lachman test: NEG - Posterior Drawer: NEG             - Collateral Ligaments:                         - Varus/Valgus (MCL/LCL) Stress test at 0, 15d: NEG             - Meniscus:                         - Thessaly: Positive (but also has pain in multiple locations with this test) Back/Hip: Tenderness to palpation at left SI joint. No swelling/bruising.  Pain with Pearlean Brownie, negative fadir. Negative SLR.  PAIN:  Are you having pain? Yes: NPRS scale: 0/10 Pain location: L knee Pain description: sore Aggravating factors: bending Relieving factors: rest  PRECAUTIONS: None  WEIGHT BEARING RESTRICTIONS No  FALLS:  Has patient fallen in last 6 months? No  LIVING ENVIRONMENT:  Lives with: lives with their family and lives with their spouse Lives in: House/apartment, does have steps at work with rails. 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  MUSCLE LENGTH: Hamstrings: Right 74 deg; Left 41 deg Thomas test: Mildly tight, L > R  POSTURE: decreased lumbar lordosis  PALPATION: TTP lumbar paraspinals, L > R  LOWER EXTREMITY ROM: R LE ROM Grossly WFL,  mildly tight in R hip ER  Passive ROM Right eval Left eval  Hip flexion  80  Hip extension  10  Hip abduction    Hip adduction    Hip internal rotation  WFL  Hip external rotation  Moderately limited  Knee flexion  Mildly limited  Knee extension  WNL  Ankle dorsiflexion    Ankle plantarflexion    Ankle inversion    Ankle eversion     (Blank rows = not tested)  LOWER EXTREMITY MMT: RLE WNL All planes  MMT Right eval Left eval  Hip flexion  3+  Hip extension  3-  Hip abduction  3  Hip adduction    Hip internal rotation    Hip external rotation    Knee flexion  3+  Knee extension  3+  Ankle dorsiflexion  4  Ankle plantarflexion    Ankle inversion    Ankle eversion     (Blank rows = not tested)  LOWER EXTREMITY SPECIAL TESTS:  Per Dr assessment           Provocative Testing: - Cruciate Ligaments:                         - Anterior Drawer/Lachman test: NEG - Posterior Drawer: NEG             - Collateral Ligaments:                         - Varus/Valgus (MCL/LCL) Stress test at 0, 15d: NEG             - Meniscus:                         - Thessaly: Positive (but also has pain in multiple locations with this test) Back/Hip: Tenderness to palpation at left SI joint. No swelling/bruising.  Pain with Corky Sox, negative fadir. Negative SLR. FUNCTIONAL TESTS:  5 times sit to stand: 18.39 Timed up and go (TUG): 11.66  L knee painful during both tests  GAIT: Distance walked: 80 Assistive device utilized: None Level of assistance: Complete Independence Comments: Antalgic gait, slow, increased R lateral sway, increased L swing, decreased L stance    TODAY'S TREATMENT: 08/16/22 NuStep L5 x 6 min S2S holding yellow ball 2x10  Hamstring curls 25lb 2x10  Leg Ext 5lb 2x10 Shoulder Ext 5lb x10, 10lb x10  Standing rows 15lb 2x10 Bridge x10 LE on PBall bridges, K2C, Oblq    Education,   PATIENT EDUCATION:  Education details: POC, HEP Person educated:  Patient Education method: Consulting civil engineer, Media planner, and Handouts Education comprehension:  verbalized understanding   HOME EXERCISE PROGRAM: [id number]  ASSESSMENT:  CLINICAL IMPRESSION: Patient enters with reports of improvement since evaluation. Pt stated that's life situation has not allowed him to complete HEP. L knee weakness reported with seated leg curls and extensions. Pt stated he could feel his L knee mote with extensions. No reports of increase pain during session.    OBJECTIVE IMPAIRMENTS Abnormal gait, decreased activity tolerance, decreased coordination, decreased mobility, difficulty walking, decreased ROM, decreased strength, impaired flexibility, improper body mechanics, postural dysfunction, and pain.   ACTIVITY LIMITATIONS bending, standing, squatting, stairs, and locomotion level  PARTICIPATION LIMITATIONS: cleaning, occupation, and yard work  PERSONAL FACTORS Past/current experiences and 1 comorbidity: Lumbar disc herniation  are also affecting patient's functional outcome.   REHAB POTENTIAL: Good  CLINICAL DECISION MAKING: Stable/uncomplicated  EVALUATION COMPLEXITY: Moderate   GOALS: Goals reviewed with patient? Yes  SHORT TERM GOALS: Target date: 08/23/2022  I with basic HEP Baseline: Goal status: INITIAL LONG TERM GOALS: Target date: 10/11/2022   I with final HEP Baseline:  Goal status: INITIAL  2.  Improve 5 x STS to < 12 sec with no C/O pain Baseline: 18.3, painful to L knee Goal status: INITIAL  3.  Patient will demonstrate improved LLE strength by 1 muscle grade in all weak areas. Baseline: 3-(3-)/5 Goal status: INITIAL  4.  Patient will ambulate at least 400' with no deviations, no antalgic gait noted, on level and unlevel surfaces. Baseline: 80', level, mult gait deviations Goal status: INITIAL  5.  Patient will report ability to perform his normal daily activities with pain of < 3/10 in L hip and knee Baseline: up to 6/10 Goal  status: INITIAL   PLAN: PT FREQUENCY: 1x/week  PT DURATION: 10 weeks  PLANNED INTERVENTIONS: Therapeutic exercises, Therapeutic activity, Neuromuscular re-education, Balance training, Gait training, Patient/Family education, Self Care, Joint mobilization, Stair training, Dry Needling, Electrical stimulation, Cryotherapy, Moist heat, Vasopneumatic device, Ionotophoresis 4mg /ml Dexamethasone, and Manual therapy  PLAN FOR NEXT SESSION: Update HEP, assess pain and swelling in L knee.   , DPT 08/16/2022, 8:46 AM

## 2022-08-30 ENCOUNTER — Encounter: Payer: BC Managed Care – PPO | Admitting: Physical Therapy

## 2022-09-13 ENCOUNTER — Encounter: Payer: Self-pay | Admitting: Physical Therapy

## 2022-09-13 ENCOUNTER — Ambulatory Visit: Payer: BC Managed Care – PPO | Attending: Family Medicine | Admitting: Physical Therapy

## 2022-09-13 DIAGNOSIS — G8929 Other chronic pain: Secondary | ICD-10-CM | POA: Diagnosis present

## 2022-09-13 DIAGNOSIS — M25552 Pain in left hip: Secondary | ICD-10-CM | POA: Insufficient documentation

## 2022-09-13 DIAGNOSIS — M25562 Pain in left knee: Secondary | ICD-10-CM | POA: Insufficient documentation

## 2022-09-13 DIAGNOSIS — R262 Difficulty in walking, not elsewhere classified: Secondary | ICD-10-CM | POA: Diagnosis present

## 2022-09-13 DIAGNOSIS — M6281 Muscle weakness (generalized): Secondary | ICD-10-CM | POA: Diagnosis present

## 2022-09-13 NOTE — Therapy (Signed)
OUTPATIENT PHYSICAL THERAPY LOWER EXTREMITY EVALUATION   Patient Name: Mark Friedman. MRN: [id number] DOB:1971-07-08, 51 y.o., male Today's Date: 09/13/2022   PT End of Session - 09/13/22 0851     Visit Number 3    Date for PT Re-Evaluation 10/11/22    PT Start Time 0851    PT Stop Time 0930    PT Time Calculation (min) 39 min    Activity Tolerance Patient tolerated treatment well;Patient limited by pain    Behavior During Therapy [facility] for tasks assessed/performed             Past Medical History:  Diagnosis Date   Asthma    Chronic low back pain    Environmental allergies    Past Surgical History:  Procedure Laterality Date   NO PAST SURGERIES     Patient Active Problem List   Diagnosis Date Noted   Mild intermittent asthma without complication 86/76/7209   Plantar fasciitis of right foot 10/24/2017   Environmental allergies 09/16/2017   Chronic left-sided low back pain without sciatica 08/15/2017   BMI 40.0-44.9, adult ([place]) 06/24/2012   Asthma 06/24/2012   Perennial allergic rhinitis 06/24/2012    PCP: Riki Sheer  REFERRING PROVIDER: Dene Gentry, MD   REFERRING DIAG: Diagnosis M25.561 (ICD-10-CM) - Right knee pain, unspecified chronicity   THERAPY DIAG:  Acute pain of left knee  Chronic left hip pain  Muscle weakness (generalized)  Difficulty in walking, not elsewhere classified  Rationale for Evaluation and Treatment Rehabilitation  ONSET DATE: 07/18/2022   SUBJECTIVE:   SUBJECTIVE STATEMENT: "Pretty good" Have been feeling better actually, bothers him some when he is on his feet more  PERTINENT HISTORY: Asthma, Low back pain with radiation to left leg - 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 Left knee: No TTP to palpation.  Inspection was negative for erythema, ecchymosis, and obvious effusion. No obvious bony abnormalities or signs of osteophyte development. Palpation yielded no asymmetric warmth; No joint  line tenderness; No condyle tenderness; No patellar tenderness; Mild knee crepitus. Patellar and quadriceps tendons unremarkable. No obvious Baker's cyst development. ROM normal in flexion (135 degrees) and extension (0 degrees). Strength 5/5 with knee flexion and extension. Neurovascularly intact bilaterally.              Provocative Testing: - Cruciate Ligaments:                         - Anterior Drawer/Lachman test: NEG - Posterior Drawer: NEG             - Collateral Ligaments:                         - Varus/Valgus (MCL/LCL) Stress test at 0, 15d: NEG             - Meniscus:                         - Thessaly: Positive (but also has pain in multiple locations with this test) Back/Hip: Tenderness to palpation at left SI joint. No swelling/bruising.  Pain with Corky Sox, negative fadir. Negative SLR.  PAIN:  Are you having pain? Yes: NPRS scale: 0/10 Pain location: L knee Pain description: sore Aggravating factors: bending Relieving factors: rest  PRECAUTIONS: None  WEIGHT BEARING RESTRICTIONS No  FALLS:  Has patient fallen in last 6 months? No  LIVING ENVIRONMENT: Lives with:  lives with their family and lives with their spouse Lives in: House/apartment, does have steps at work with rails. 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  MUSCLE LENGTH: Hamstrings: Right 74 deg; Left 41 deg Thomas test: Mildly tight, L > R  POSTURE: decreased lumbar lordosis  PALPATION: TTP lumbar paraspinals, L > R  LOWER EXTREMITY ROM: R LE ROM Grossly WFL, mildly  tight in R hip ER  Passive ROM Right eval Left eval  Hip flexion  80  Hip extension  10  Hip abduction    Hip adduction    Hip internal rotation  WFL  Hip external rotation  Moderately limited  Knee flexion  Mildly limited  Knee extension  WNL  Ankle dorsiflexion    Ankle plantarflexion    Ankle inversion    Ankle eversion     (Blank rows = not tested)  LOWER EXTREMITY MMT: RLE WNL All planes  MMT Right eval Left eval  Hip flexion  3+  Hip extension  3-  Hip abduction  3  Hip adduction    Hip internal rotation    Hip external rotation    Knee flexion  3+  Knee extension  3+  Ankle dorsiflexion  4  Ankle plantarflexion    Ankle inversion    Ankle eversion     (Blank rows = not tested)  LOWER EXTREMITY SPECIAL TESTS:  Per Dr assessment           Provocative Testing: - Cruciate Ligaments:                         - Anterior Drawer/Lachman test: NEG - Posterior Drawer: NEG             - Collateral Ligaments:                         - Varus/Valgus (MCL/LCL) Stress test at 0, 15d: NEG             - Meniscus:                         - Thessaly: Positive (but also has pain in multiple locations with this test) Back/Hip: Tenderness to palpation at left SI joint. No swelling/bruising.  Pain with Corky Sox, negative fadir. Negative SLR. FUNCTIONAL TESTS:  5 times sit to stand: 18.39 Timed up and go (TUG): 11.66  L knee painful during both tests  GAIT: Distance walked: 80 Assistive device utilized: None Level of assistance: Complete Independence Comments: Antalgic gait, slow, increased R lateral sway, increased L swing, decreased L stance    TODAY'S TREATMENT: 09/13/22 Bike L3 x 6 min S2S holding yellow ball 2x10  Rows & Lats 25lb 2x10  Hamstring curls 25lb 2x10  Leg Ext 5lb 2x10 6in step ups x10 each  08/16/22 NuStep L5 x 6 min S2S holding yellow ball 2x10  Hamstring curls 25lb 2x10  Leg Ext 5lb 2x10 Shoulder Ext 5lb x10, 10lb x10  Standing rows 15lb  2x10 Bridge  x10 LE on PBall bridges, K2C, Oblq    Education,   PATIENT EDUCATION:  Education details: POC, HEP Person educated: Patient Education method: Explanation, Demonstration, and Handouts Education comprehension: verbalized understanding   HOME EXERCISE PROGRAM: [phone]  ASSESSMENT:  CLINICAL IMPRESSION: Patient again enters with reports of improvement. He has progressed meeting some LTG's. Pt reports doing his exercises some at home. L knee weakness reported with step ups. No reports of increase pain during session.    OBJECTIVE IMPAIRMENTS Abnormal gait, decreased activity tolerance, decreased coordination, decreased mobility, difficulty walking, decreased ROM, decreased strength, impaired flexibility, improper body mechanics, postural dysfunction, and pain.   ACTIVITY LIMITATIONS bending, standing, squatting, stairs, and locomotion level  PARTICIPATION LIMITATIONS: cleaning, occupation, and yard work  PERSONAL FACTORS Past/current experiences and 1 comorbidity: Lumbar disc herniation  are also affecting patient's functional outcome.   REHAB POTENTIAL: Good  CLINICAL DECISION MAKING: Stable/uncomplicated  EVALUATION COMPLEXITY: Moderate   GOALS: Goals reviewed with patient? Yes  SHORT TERM GOALS: Target date: 08/23/2022  I with basic HEP Baseline: Goal status: MeT LONG TERM GOALS: Target date: 10/11/2022   I with final HEP Baseline:  Goal status: Reports having a gym at home  2.  Improve 5 x STS to < 12 sec with no C/O pain Baseline: 18.3, painful to L knee Goal status: 9  sec Met 09/13/22  3.  Patient will demonstrate improved LLE strength by 1 muscle grade in all weak areas. Baseline: 3-(3-)/5 Goal status: INITIAL  4.  Patient will ambulate at least 400' with no deviations, no antalgic gait noted, on level and unlevel surfaces. Baseline: 80', level, mult gait deviations Goal status: INITIAL  5.  Patient will report ability to perform his  normal daily activities with pain of < 3/10 in L hip and knee Baseline: up to 6/10 Goal status: Met 09/13/22   PLAN: PT FREQUENCY: 1x/week  PT DURATION: 10 weeks  PLANNED INTERVENTIONS: Therapeutic exercises, Therapeutic activity, Neuromuscular re-education, Balance training, Gait training, Patient/Family education, Self Care, Joint mobilization, Stair training, Dry Needling, Electrical stimulation, Cryotherapy, Moist heat, Vasopneumatic device, Ionotophoresis 36m/ml Dexamethasone, and Manual therapy  PLAN FOR NEXT SESSION: Update HEP, assess pain and swelling in L knee.   SMarcelina Morel DPT 09/13/2022, 8:52 AM

## 2022-09-21 ENCOUNTER — Encounter: Payer: BC Managed Care – PPO | Admitting: Physical Therapy

## 2022-09-27 ENCOUNTER — Ambulatory Visit: Payer: BC Managed Care – PPO | Attending: Family Medicine | Admitting: Physical Therapy

## 2022-09-27 ENCOUNTER — Encounter: Payer: Self-pay | Admitting: Physical Therapy

## 2022-09-27 DIAGNOSIS — M25562 Pain in left knee: Secondary | ICD-10-CM | POA: Insufficient documentation

## 2022-09-27 DIAGNOSIS — M6281 Muscle weakness (generalized): Secondary | ICD-10-CM | POA: Insufficient documentation

## 2022-09-27 DIAGNOSIS — R262 Difficulty in walking, not elsewhere classified: Secondary | ICD-10-CM | POA: Insufficient documentation

## 2022-09-27 NOTE — Therapy (Signed)
OUTPATIENT PHYSICAL THERAPY LOWER EXTREMITY EVALUATION   Patient Name: Mark Friedman. MRN: [id number] DOB:1971-09-25, 51 y.o., male Today's Date: 09/27/2022   PT End of Session - 09/27/22 0812     Visit Number 4    Date for PT Re-Evaluation 10/11/22    PT Start Time 0812    PT Stop Time 0845    PT Time Calculation (min) 33 min    Activity Tolerance Patient tolerated treatment well    Behavior During Therapy [facility] for tasks assessed/performed             Past Medical History:  Diagnosis Date   Asthma    Chronic low back pain    Environmental allergies    Past Surgical History:  Procedure Laterality Date   NO PAST SURGERIES     Patient Active Problem List   Diagnosis Date Noted   Mild intermittent asthma without complication 68/34/1962   Plantar fasciitis of right foot 10/24/2017   Environmental allergies 09/16/2017   Chronic left-sided low back pain without sciatica 08/15/2017   BMI 40.0-44.9, adult ([place]) 06/24/2012   Asthma 06/24/2012   Perennial allergic rhinitis 06/24/2012    PCP: Riki Sheer  REFERRING PROVIDER: Dene Gentry, MD   REFERRING DIAG: Diagnosis M25.561 (ICD-10-CM) - Right knee pain, unspecified chronicity   THERAPY DIAG:  Acute pain of left knee  Muscle weakness (generalized)  Difficulty in walking, not elsewhere classified  Rationale for Evaluation and Treatment Rehabilitation  ONSET DATE: 07/18/2022   SUBJECTIVE:   SUBJECTIVE STATEMENT: Doing ok  PERTINENT HISTORY: Asthma, Low back pain with radiation to left leg - 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 Left knee: No TTP to palpation.  Inspection was negative for erythema, ecchymosis, and obvious effusion. No obvious bony abnormalities or signs of osteophyte development. Palpation yielded no asymmetric warmth; No joint line tenderness; No condyle tenderness; No patellar tenderness; Mild knee crepitus. Patellar and quadriceps tendons unremarkable. No  obvious Baker's cyst development. ROM normal in flexion (135 degrees) and extension (0 degrees). Strength 5/5 with knee flexion and extension. Neurovascularly intact bilaterally.              Provocative Testing: - Cruciate Ligaments:                         - Anterior Drawer/Lachman test: NEG - Posterior Drawer: NEG             - Collateral Ligaments:                         - Varus/Valgus (MCL/LCL) Stress test at 0, 15d: NEG             - Meniscus:                         - Thessaly: Positive (but also has pain in multiple locations with this test) Back/Hip: Tenderness to palpation at left SI joint. No swelling/bruising.  Pain with Corky Sox, negative fadir. Negative SLR.  PAIN:  Are you having pain? Yes: NPRS scale: 0/10 Pain location: L knee Pain description: sore Aggravating factors: bending Relieving factors: rest  PRECAUTIONS: None  WEIGHT BEARING RESTRICTIONS No  FALLS:  Has patient fallen in last 6 months? No  LIVING ENVIRONMENT: Lives with: lives with their family and lives with their spouse Lives in: House/apartment, does have steps at work with rails. 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  MUSCLE LENGTH: Hamstrings: Right 74 deg; Left 41 deg Thomas test: Mildly tight, L > R  POSTURE: decreased lumbar lordosis  PALPATION: TTP lumbar paraspinals, L > R  LOWER EXTREMITY ROM: R LE ROM Grossly WFL, mildly tight in R hip ER  Passive ROM Right eval Left eval  Hip flexion  80  Hip extension  10  Hip abduction    Hip adduction     Hip internal rotation  [facility]  Hip external rotation  Moderately limited  Knee flexion  Mildly limited  Knee extension  WNL  Ankle dorsiflexion    Ankle plantarflexion    Ankle inversion    Ankle eversion     (Blank rows = not tested)  LOWER EXTREMITY MMT: RLE WNL All planes  MMT Right eval Left eval L 09/27/22  Hip flexion  3+ 4+  Hip extension  3- 4-  Hip abduction  3 4  Hip adduction     Hip internal rotation     Hip external rotation     Knee flexion  3+ 5  Knee extension  3+ 5  Ankle dorsiflexion  4 4+  Ankle plantarflexion     Ankle inversion     Ankle eversion      (Blank rows = not tested)  LOWER EXTREMITY SPECIAL TESTS:  Per Dr assessment           Provocative Testing: - Cruciate Ligaments:                         - Anterior Drawer/Lachman test: NEG - Posterior Drawer: NEG             - Collateral Ligaments:                         - Varus/Valgus (MCL/LCL) Stress test at 0, 15d: NEG             - Meniscus:                         - Thessaly: Positive (but also has pain in multiple locations with this test) Back/Hip: Tenderness to palpation at left SI joint. No swelling/bruising.  Pain with Corky Sox, negative fadir. Negative SLR. FUNCTIONAL TESTS:  5 times sit to stand: 18.39 Timed up and go (TUG): 11.66  L knee painful during both tests  GAIT: Distance walked: 80 Assistive device utilized: None Level of assistance: Complete Independence Comments: Antalgic gait, slow, increased R lateral sway, increased L swing, decreased L stance    TODAY'S TREATMENT: 09/27/22 NuStep L5 x 6 min Leg press 60lb 3x10 Lateral 6in step ups  Heel raises 3x15 Forward step ups 6in x10  S2S on airex 2x10 Hamstring curls 35lb 2x10   09/13/22 Bike L3 x 6 min S2S holding yellow ball 2x10  Rows & Lats 25lb 2x10  Hamstring curls 25lb 2x10  Leg Ext 5lb 2x10 6in step ups x10 each  08/16/22 NuStep L5 x 6 min S2S holding yellow  ball 2x10  Hamstring curls 25lb 2x10  Leg Ext  5lb 2x10 Shoulder Ext 5lb x10, 10lb x10  Standing rows 15lb 2x10 Bridge x10 LE on PBall bridges, K2C, Oblq    PATIENT EDUCATION:  Education details: POC, HEP Person educated: Patient Education method: Consulting civil engineer, Media planner, and Handouts Education comprehension: verbalized understanding   HOME EXERCISE PROGRAM: [id number]  ASSESSMENT:  CLINICAL IMPRESSION: Pt ~ 12 minutes for today's session. Patient enters again doing ok. He has increased his LE strength meeting goal. No issues with machine level interventions. Some L knee discomfort mentioned with lateral step ups.    OBJECTIVE IMPAIRMENTS Abnormal gait, decreased activity tolerance, decreased coordination, decreased mobility, difficulty walking, decreased ROM, decreased strength, impaired flexibility, improper body mechanics, postural dysfunction, and pain.   ACTIVITY LIMITATIONS bending, standing, squatting, stairs, and locomotion level  PARTICIPATION LIMITATIONS: cleaning, occupation, and yard work  PERSONAL FACTORS Past/current experiences and 1 comorbidity: Lumbar disc herniation  are also affecting patient's functional outcome.   REHAB POTENTIAL: Good  CLINICAL DECISION MAKING: Stable/uncomplicated  EVALUATION COMPLEXITY: Moderate   GOALS: Goals reviewed with patient? Yes  SHORT TERM GOALS: Target date: 08/23/2022  I with basic HEP Baseline: Goal status: MeT LONG TERM GOALS: Target date: 10/11/2022   I with final HEP Baseline:  Goal status: Reports having a gym at home  2.  Improve 5 x STS to < 12 sec with no C/O pain Baseline: 18.3, painful to L knee Goal status: 9  sec Met 09/13/22  3.  Patient will demonstrate improved LLE strength by 1 muscle grade in all weak areas. Baseline: 3-(3-)/5 Goal status: Met 09/27/22   4.  Patient will ambulate at least 400' with no deviations, no antalgic gait noted, on level and unlevel surfaces. Baseline: 80', level, mult gait deviations Goal status: INITIAL  5.   Patient will report ability to perform his normal daily activities with pain of < 3/10 in L hip and knee Baseline: up to 6/10 Goal status: Met 09/13/22   PLAN: PT FREQUENCY: 1x/week  PT DURATION: 10 weeks  PLANNED INTERVENTIONS: Therapeutic exercises, Therapeutic activity, Neuromuscular re-education, Balance training, Gait training, Patient/Family education, Self Care, Joint mobilization, Stair training, Dry Needling, Electrical stimulation, Cryotherapy, Moist heat, Vasopneumatic device, Ionotophoresis 64m/ml Dexamethasone, and Manual therapy  PLAN FOR NEXT SESSION: assess pain and swelling in L knee.  RCheri Fowler PTA 09/27/2022, 8:12 AM

## 2022-10-03 ENCOUNTER — Encounter: Payer: Self-pay | Admitting: Family Medicine

## 2022-10-03 ENCOUNTER — Ambulatory Visit (INDEPENDENT_AMBULATORY_CARE_PROVIDER_SITE_OTHER): Payer: BC Managed Care – PPO | Admitting: Family Medicine

## 2022-10-03 VITALS — BP 146/87 | Ht 69.0 in | Wt 275.0 lb

## 2022-10-03 DIAGNOSIS — M25561 Pain in right knee: Secondary | ICD-10-CM

## 2022-10-03 DIAGNOSIS — M5442 Lumbago with sciatica, left side: Secondary | ICD-10-CM | POA: Diagnosis not present

## 2022-10-03 DIAGNOSIS — G8929 Other chronic pain: Secondary | ICD-10-CM

## 2022-10-03 NOTE — Progress Notes (Signed)
PCP: Sharlene Dory, DO  Subjective:   HPI: Patient is a 51 y.o. male here for left knee, low back pain.  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, kneeling down with knee pads and then noticed the pain afterward -Says he sometimes has clicking, popping of the knee -No falls or trauma that he can recall -Wonders if he has fluid buildup in the joint and needs an aspiration -Feels his range of motion is decreased with flexion, can fully extend -Sometimes gets numbness over anterior thigh and into knee that radiates from the back  Left-sided back pain, chronic -Ongoing for years, worse on the left side -No red flag symptoms -Does a lot of heavy lifting at work for [employer] -Completes home exercises  11/15: Patient reports he's doing well, improving. Left knee a little sore just because he had to get under house and crawl due to a water leak. No catching, locking, giving out. Still with some popping but not painful. Left side of low back hurts only occasionally now. Associated numbness/tingling into left leg. Has been doing physical therapy, home exercises and reports benefit with these.  Past Medical History:  Diagnosis Date   Asthma    Chronic low back pain    Environmental allergies     Current Outpatient Medications on File Prior to Visit  Medication Sig Dispense Refill   Air Cleaners (VICKS AIR PURIFIER/HEPA) (Device) MISC 1 each by Does not apply route daily. 1 each 0   albuterol (VENTOLIN HFA) 108 (90 Base) MCG/ACT inhaler Inhale 2 puffs into the lungs every 4 (four) hours as needed for wheezing or shortness of breath. 18 g 1   budesonide (PULMICORT FLEXHALER) 180 MCG/ACT inhaler Inhale 1 puff into the lungs in the morning and at bedtime. 3 each 1   levocetirizine (XYZAL) 5 MG tablet TAKE 1 TABLET BY MOUTH EVERY DAY IN THE EVENING 90 tablet 2   No current facility-administered medications on file prior to  visit.    Past Surgical History:  Procedure Laterality Date   NO PAST SURGERIES      No Known Allergies  BP (!) 146/87   Ht 5\' 9"  (1.753 m)   Wt 275 lb (124.7 kg)   BMI 40.61 kg/m       No data to display              No data to display              Objective:  Physical Exam:  Gen: NAD, comfortable in exam room  Left knee: No gross deformity, ecchymoses, swelling. No TTP. FROM with normal strength. Negative ant/post drawers. Negative valgus/varus testing. Negative lachman. Negative mcmurrays, apleys, thessalys NV intact distally.  Back: No gross deformity, scoliosis. No TTP paraspinal regions.  No midline or bony TTP. Strength LEs 5/5 all muscle groups.   Negative SLRs. Sensation intact to light touch bilaterally. Negative logroll bilateral hips, fadir, faber.  Assessment & Plan:  1. Left knee pain - 2/2 meniscus tear, much improved with negative testing today.  F/u prn.  2. Low back pain with radiation into left lower extremity.  History of radicular source.  SI joint testing negative today, improved.  Continue physical therapy every 2-3 weeks.  Home exercises.  Tylenol if needed.  He takes vitamins and supplements as well.

## 2022-10-19 ENCOUNTER — Ambulatory Visit: Payer: BC Managed Care – PPO | Admitting: Internal Medicine

## 2022-10-19 ENCOUNTER — Encounter: Payer: Self-pay | Admitting: Internal Medicine

## 2022-10-19 VITALS — BP 140/90 | HR 75 | Temp 97.6°F | Resp 19 | Ht 69.0 in | Wt 288.8 lb

## 2022-10-19 DIAGNOSIS — T781XXA Other adverse food reactions, not elsewhere classified, initial encounter: Secondary | ICD-10-CM

## 2022-10-19 DIAGNOSIS — Z9109 Other allergy status, other than to drugs and biological substances: Secondary | ICD-10-CM

## 2022-10-19 DIAGNOSIS — J3089 Other allergic rhinitis: Secondary | ICD-10-CM

## 2022-10-19 DIAGNOSIS — J4540 Moderate persistent asthma, uncomplicated: Secondary | ICD-10-CM | POA: Diagnosis not present

## 2022-10-19 MED ORDER — LEVOCETIRIZINE DIHYDROCHLORIDE 5 MG PO TABS: ORAL_TABLET | ORAL | 2 refills | Status: DC

## 2022-10-19 MED ORDER — AIRSUPRA 90-80 MCG/ACT IN AERO: 2.0000 | INHALATION_SPRAY | RESPIRATORY_TRACT | 3 refills | Status: DC | PRN

## 2022-10-19 MED ORDER — PULMICORT FLEXHALER 180 MCG/ACT IN AEPB: 1.0000 | INHALATION_SPRAY | Freq: Two times a day (BID) | RESPIRATORY_TRACT | 1 refills | Status: DC

## 2022-10-19 MED ORDER — ALBUTEROL SULFATE HFA 108 (90 BASE) MCG/ACT IN AERS: 2.0000 | INHALATION_SPRAY | RESPIRATORY_TRACT | 1 refills | Status: DC | PRN

## 2022-10-19 NOTE — Progress Notes (Signed)
FOLLOW UP Date of Service/Encounter:  10/19/22   Subjective:  Mark Friedman. (DOB: May 08, 1971) is a 51 y.o. male who [facility] in re-evaluation of the following: asthma, allergic rhinitis History obtained from: chart review and patient.  For Review, LV was on 04/27/22  with Dr.Meng Friedman seen for routine follow-up. His asthma was improved, and we are switching him to Pulmicort Flexhaler due to insurance coverage. He wants to do allergy injections but has concerns because of misinformation regarding COVID-19 vaccination.   FEV 1 99% at last visit ------------------------------------- Today presents for follow-up. He feels his breathing is doing well.  He is no longer wheezing. He does use pulmicort but forgets to use every day. Since this doesn't make him feel immediately better, he often forgets to use it. He uses rescue inhaler around once a day since being sick with a cold about 4 to 5 days ago.  He is feeling better now.  Nighttime is the worst and he is having some chest congestion. He has had some sputum production.  Otherwise, he had been doing well. Not using albuterol more than a few times per week. No longer wheezing. HE does take zyrtec in AM and xyzal in PM (makes him drowsy) because he does not like using nasal sprays.  He is still weary about using allergy injections. We discussed Singulair, but will continue with current management for now.  ------------------------------------ Pertinent History/Diagnostics:  Asthma:  diagnosed in childhood, hospitalized multiple times as a child,  Identified Triggers:  Allergens and irritants and respiratory illness - pre/post spirometry (12/25/21): ratio 110%, 84% FEV1 (pre), + 9% (92%) FEV1 (post), + 19% increase in FVC Allergic Rhinitis:  Was on allergy shots, stopped 20 years ago (1998), did these when he lived in [place] were helpful to him. He is opposed to nasal sprays.  -blood  drawl 2018-grasses negative, molds positive for Aspergillus and Candida , animal panel positive for mouse, nut panel negative  -01/25/2022: SPT positive to dust mites, cat, horse, mouse History of previous positive food allergy test in youth, not avoiding any specific foods.  SPT negative to most common food allergens. -Blood drawl on 01/30/2022-positive to dust mites, molds, cat, dog mouse.  Total IgE 248  Allergies as of 10/19/2022   No Known Allergies      Medication List        Accurate as of October 19, 2022  1:03 PM. If you have any questions, ask your nurse or doctor.          STOP taking these medications    albuterol 108 (90 Base) MCG/ACT inhaler Commonly known as: VENTOLIN HFA Stopped by: Mark Monte, Mark Friedman       TAKE these medications    Airsupra 90-80 MCG/ACT Aero Generic drug: Albuterol-Budesonide Inhale 2 puffs into the lungs as needed (maximum 12 puffs/day). Started by: Mark Monte, Mark Friedman   levocetirizine 5 MG tablet Commonly known as: XYZAL TAKE 1 TABLET BY MOUTH EVERY DAY IN THE EVENING   Pulmicort Flexhaler 180 MCG/ACT inhaler Generic drug: budesonide Inhale 1 puff into the lungs in the morning and at bedtime.   Vicks Air Purifier/HEPA (Device) Misc 1 each by Does not apply route daily.       Past Medical History:  Diagnosis Date   Asthma    Chronic low back pain    Environmental allergies    Past Surgical History:  Procedure Laterality Date   NO PAST SURGERIES  Otherwise, there have been no changes to his past medical history, surgical history, family history, or social history.  ROS: All others negative except as noted per HPI.   Objective:  BP (!) 140/90   Pulse 75   Temp 97.6 F (36.4 C) (Temporal)   Resp 19   Ht 5\' 9"  (1.753 m)   Wt 288 lb 12.8 oz (131 kg)   SpO2 97%   BMI 42.65 kg/m  Body mass index is 42.65 kg/m. Physical Exam: General Appearance:  Alert, cooperative, no distress, appears stated age  Head:   Normocephalic, without obvious abnormality, atraumatic  Eyes:  Conjunctiva clear, EOM's intact  Nose: Nares normal, hypertrophic turbinates, normal mucosa, and no visible anterior polyps  Throat: Lips, tongue normal; teeth and gums normal, normal posterior oropharynx  Neck: Supple, symmetrical  Lungs:   clear to auscultation bilaterally, Respirations unlabored, no coughing  Heart:  regular rate and rhythm and no murmur, Appears well perfused  Extremities: No edema  Skin: Skin color, texture, turgor normal, no rashes or lesions on visualized portions of skin  Neurologic: No gross deficits  Spirometry:  Tracings reviewed. His effort: Good reproducible efforts. FVC: 5.11L FEV1: 3.84L, 100% predicted FEV1/FVC ratio: 0.75 Interpretation: Spirometry consistent with normal pattern.  Please see scanned spirometry results for details.  Assessment/Plan   Moderate Persistent Asthma-controlled -Your breathing test looked great! - Controller Inhaler: Pulmicort flexhaler 180 mcg, 1 puff twice a day This Should Be Used Everyday - Rinse mouth out after use - Rescue Inhaler: Airsupra 2 puffs. Use as needed for chest tightness, wheezing, or coughing.  Can also use 15 minutes prior to exercise if you have symptoms with activity. Maximum 12 puffs/day. - Asthma is not controlled if:  - Symptoms are occurring >2 times a week OR  - >2 times a month nighttime awakenings  - You are requiring systemic steroids (prednisone/steroid injections) more than once per year  - Your require hospitalization for your asthma.  - Please call the clinic to schedule a follow up if these symptoms arise  Allergic rhinitis: controlled - allergy avoidance toward dust mites, cat, horse, mouse, molds - will avoid nasal sprays for now - Continue over the counter antihistamine daily or daily as needed.   Use a NON-DROWSY option so that you can take the full amount. -Xyzal (Levocetirinze) 5mg  in PM - Zyrtec (cetirizine) 10 mg in  AM - Continue Mucinex 600 mg twice a day as needed, drink plenty of water - allergy injections-this will teach your immune system to be tolerant of what you allergic to, if you decide to do this, call for scheduling  History of allergic reaction, unknown cause: stable -Food allergy testing today was negative -Please let know if this recurs  It was a pleasure seeing you again in clinic today! Follow-up in 6 months, sooner if needed.   [place], Mark Friedman  [facility]

## 2022-10-19 NOTE — Patient Instructions (Addendum)
Moderate Persistent Asthma-controlled -Your breathing test looked great! - Controller Inhaler: Pulmicort flexhaler 180 mcg, 1 puff twice a day This Should Be Used Everyday - Rinse mouth out after use - Rescue Inhaler:  Airsupra 2 puffs . Use as needed for chest tightness, wheezing, or coughing.  Can also use 15 minutes prior to exercise if you have symptoms with activity. Maximum 12 puffs/day. - Asthma is not controlled if:  - Symptoms are occurring >2 times a week OR  - >2 times a month nighttime awakenings  - You are requiring systemic steroids (prednisone/steroid injections) more than once per year  - Your require hospitalization for your asthma.  - Please call the clinic to schedule a follow up if these symptoms arise  Allergic rhinitis: - allergy avoidance toward dust mites, cat, horse, mouse, molds - will avoid nasal sprays for now - Continue over the counter antihistamine daily or daily as needed.   Use a NON-DROWSY option so that you can take the full amount. -Xyzal (Levocetirinze) 5mg  in PM - Zyrtec (cetirizine) 10 mg in AM - Continue Mucinex 600 mg twice a day as needed, drink plenty of water - allergy injections-this will teach your immune system to be tolerant of what you allergic to, if you decide to do this, call for scheduling  History of allergic reaction, unknown cause:  -Food allergy testing today was negative -Please let us know if this recurs  It was a pleasure seeing you again in clinic today! Follow-up in 6 months, sooner if needed.   DUST MITE AVOIDANCE MEASURES:  There are three main measures that need and can be taken to avoid house dust mites:  Reduce accumulation of dust in general -reduce furniture, clothing, carpeting, books, stuffed animals, especially in bedroom  Separate yourself from the dust -use pillow and mattress encasements (can be found at stores such as [employer] or online) -avoid direct exposure to air condition flow -use  a HEPA filter device, especially in the bedroom; you can also use a HEPA filter vacuum cleaner -wipe dust with a moist towel instead of a dry towel or broom when cleaning  Decrease mites and/or their secretions -wash clothing and linen and stuffed animals at highest temperature possible, at least every 2 weeks -stuffed animals can also be placed in a bag and put in a freezer overnight  Despite the above measures, it is impossible to eliminate dust mites or their allergen completely from your home.  With the above measures the burden of mites in your home can be diminished, with the goal of minimizing your allergic symptoms.  Success will be reached only when implementing and using all means together.  Control of Mold Allergen   Mold and fungi can grow on a variety of surfaces provided certain temperature and moisture conditions exist.  Outdoor molds grow on plants, decaying vegetation and soil.  The major outdoor mold, Alternaria and Cladosporium, are found in very high numbers during hot and dry conditions.  Generally, a late Summer - Fall peak is seen for common outdoor fungal spores.  Rain will temporarily lower outdoor mold spore count, but counts rise rapidly when the rainy period ends.  The most important indoor molds are Aspergillus and Penicillium.  Dark, humid and poorly ventilated basements are ideal sites for mold growth.  The next most common sites of mold growth are the bathroom and the kitchen.  Outdoor (Seasonal) Mold Control  Use air conditioning and keep windows closed Avoid exposure to  decaying vegetation. Avoid leaf raking. Avoid grain handling. Consider wearing a face mask if working in moldy areas.    Indoor (Perennial) Mold Control   Maintain humidity below 50%. Clean washable surfaces with 5% bleach solution. Remove sources e.g. contaminated carpets.   Control of Dog or Cat Allergen  Avoidance is the best way to manage a dog or cat allergy. If you have a dog or  cat and are allergic to dog or cats, consider removing the dog or cat from the home. If you have a dog or cat but don't want to find it a new home, or if your family wants a pet even though someone in the household is allergic, here are some strategies that may help keep symptoms at bay:  Keep the pet out of your bedroom and restrict it to only a few rooms. Be advised that keeping the dog or cat in only one room will not limit the allergens to that room. Don't pet, hug or kiss the dog or cat; if you do, wash your hands with soap and water. High-efficiency particulate air (HEPA) cleaners run continuously in a bedroom or living room can reduce allergen levels over time. Regular use of a high-efficiency vacuum cleaner or a central vacuum can reduce allergen levels. Giving your dog or cat a bath at least once a week can reduce airborne allergen.   Tonny Bollman, MD [facility]

## 2022-10-22 ENCOUNTER — Ambulatory Visit: Payer: BC Managed Care – PPO | Admitting: Internal Medicine

## 2022-10-25 ENCOUNTER — Other Ambulatory Visit: Payer: Self-pay

## 2022-10-25 NOTE — Telephone Encounter (Signed)
Fax'd in through [facility] at [phone]. Levalbuterol hfa 2 puffs every 6 hours. Sent in 1 with no refills.

## 2023-04-19 ENCOUNTER — Ambulatory Visit: Payer: BC Managed Care – PPO | Admitting: Internal Medicine

## 2023-04-24 NOTE — Progress Notes (Signed)
FOLLOW UP Date of Service/Encounter:  04/26/23   Subjective:  Mark Friedman. (DOB: 30-Jul-1971) is a 52 y.o. male who [facility]23 in re-evaluation of the following: asthma, allergic rhinitis  History obtained from: chart review and patient.  For Review, LV was on 10/19/22  with Dr.Deacon Gadbois seen for routine follow-up. See below for summary of history and diagnostics.  Therapeutic plans/changes recommended: doing well when using pulmicort 180 mcg 1 puffs BID, no longer wheezing. Taking zyrtec in AM and xyzal in PM because can't tolerated nasal sprays. Not interested in singulair. FEV1 100% ----------------------------------------------------- Pertinent History/Diagnostics:  Asthma:  diagnosed in childhood, hospitalized multiple times as a child,  Identified Triggers:  Allergens and irritants and respiratory illness Not interested in singulair. - pre/post spirometry (12/25/21): ratio 110%, 84% FEV1 (pre), + 9% (92%) FEV1 (post), + 19% increase in FVC Allergic Rhinitis:  Was on allergy shots, stopped 20 years ago (1998), did these when he lived in [place] were helpful to him. He is opposed to nasal sprays.  -blood drawl 2018-grasses negative, molds positive for Aspergillus and Candida , animal panel positive for mouse, nut panel negative  -01/25/2022: SPT positive to dust mites, cat, horse, mouse History of previous positive food allergy test in youth, not avoiding any specific foods.  SPT negative to most common food allergens. -Blood drawl on 01/30/2022-positive to dust mites, molds, cat, dog mouse.  Total IgE 248 --------------------------------------------------- Today presents for follow-up. Asthma: using airsupra infrequently.. Continues on pulmicort 180 mcg 1 puff BID.  He does notice if he sleeps near his dogs, his breathing is slightly worse. Uses rescue around once per week.  No steroids or antibiotics since we last saw him. He did  have a spell of wheezing a few months back due to a cold. He was able to get symptoms under control after a few days so did not come in for evaluation. Allergic rhinitis: He has only been taking one antihistamine per day, but couldn't remember which.  He does not combine them both on the same day.  He feels that his allergy symptoms are controlled with this regimen.  He is still not interested in allergy injections. He has had no further episodes concerning for an allergic reaction.  Allergies as of 04/26/2023   No Known Allergies      Medication List        Accurate as of April 26, 2023  1:26 PM. If you have any questions, ask your nurse or doctor.          Airsupra 90-80 MCG/ACT Aero Generic drug: Albuterol-Budesonide Inhale 2 puffs into the lungs as needed (maximum 12 puffs/day).   levocetirizine 5 MG tablet Commonly known as: XYZAL TAKE 1 TABLET BY MOUTH EVERY DAY IN THE EVENING   montelukast 10 MG tablet Commonly known as: SINGULAIR Take by mouth.   Pulmicort Flexhaler 180 MCG/ACT inhaler Generic drug: budesonide Inhale 1 puff into the lungs in the morning and at bedtime.   Ventolin HFA 108 (90 Base) MCG/ACT inhaler Generic drug: albuterol Inhale into the lungs.   Vicks Air Purifier/HEPA (Device) Misc 1 each by Does not apply route daily.       Past Medical History:  Diagnosis Date   Asthma    Chronic low back pain    Environmental allergies    Past Surgical History:  Procedure Laterality Date   NO PAST SURGERIES     Otherwise, there have been no changes to  his past medical history, surgical history, family history, or social history.  ROS: All others negative except as noted per HPI.   Objective:  BP (!) 152/88   Pulse 66   Temp 97.9 F (36.6 C) (Temporal)   Resp 18   SpO2 96%  There is no height or weight on file to calculate BMI. Physical Exam: General Appearance:  Alert, cooperative, no distress, appears stated age  Head:  Normocephalic,  without obvious abnormality, atraumatic  Eyes:  Conjunctiva clear, EOM's intact  Nose: Nares normal, hypertrophic turbinates and normal mucosa  Throat: Lips, tongue normal; teeth and gums normal, normal posterior oropharynx  Neck: Supple, symmetrical  Lungs:   clear to auscultation bilaterally, Respirations unlabored, no coughing  Heart:  regular rate and rhythm and no murmur, Appears well perfused  Extremities: No edema  Skin: Skin color, texture, turgor normal and no rashes or lesions on visualized portions of skin  Neurologic: No gross deficits  Spirometry:  Tracings reviewed. His effort: Good reproducible efforts. FVC: 4.22L FEV1: 3.29L, 89% predicted FEV1/FVC ratio: 98% Interpretation: Spirometry consistent with normal pattern.  Please see scanned spirometry results for details.   Assessment/Plan   Moderate Persistent Asthma- -Your breathing test looked great! - Controller Inhaler: Pulmicort flexhaler 180 mcg, 1 puff twice a day This Should Be Used Everyday - Rinse mouth out after use SICK PLAN: Increase Pulmicort flexhaler 180 mcg 2 puffs twice a day for 2 weeks or until symptoms improve. Also use Airsupra 2 puffs every 4 hours while awake for 3 days. If no improvement,  please schedule follow-up. - Rescue Inhaler: Airsupra 2 puffs. Use as needed for chest tightness, wheezing, or coughing.  Can also use 15 minutes prior to exercise if you have symptoms with activity. Maximum 12 puffs/day. - Asthma is not controlled if:  - Symptoms are occurring >2 times a week OR  - >2 times a month nighttime awakenings  - You are requiring systemic steroids (prednisone/steroid injections) more than once per year  - Your require hospitalization for your asthma.  - Please call the clinic to schedule a follow up if these symptoms arise  Allergic rhinitis: - allergy avoidance toward dust mites, cat, horse, mouse, molds - will avoid nasal sprays for now - Continue over the counter antihistamine  daily or daily as needed.   Use a NON-DROWSY option so that you can take the full amount. -Xyzal (Levocetirinze) 5mg  in PM as needed - Zyrtec (cetirizine) 10 mg in AM as needed - Continue Mucinex 600 mg twice a day as needed, drink plenty of water - allergy injections-this will teach your immune system to be tolerant of what you allergic to, if you decide to do this, call us for scheduling  History of allergic reaction, unknown cause:  -Food allergy testing today was negative -Please let us know if this recurs  It was a pleasure seeing you again in clinic today! Follow-up in 6 months, sooner if needed.   Tonny Bollman, MD  [facility]

## 2023-04-26 ENCOUNTER — Ambulatory Visit (INDEPENDENT_AMBULATORY_CARE_PROVIDER_SITE_OTHER): Payer: BC Managed Care – PPO | Admitting: Internal Medicine

## 2023-04-26 ENCOUNTER — Encounter: Payer: Self-pay | Admitting: Internal Medicine

## 2023-04-26 VITALS — BP 152/88 | HR 66 | Temp 97.9°F | Resp 18

## 2023-04-26 DIAGNOSIS — J3089 Other allergic rhinitis: Secondary | ICD-10-CM

## 2023-04-26 DIAGNOSIS — Z9109 Other allergy status, other than to drugs and biological substances: Secondary | ICD-10-CM | POA: Diagnosis not present

## 2023-04-26 DIAGNOSIS — J4540 Moderate persistent asthma, uncomplicated: Secondary | ICD-10-CM

## 2023-04-26 MED ORDER — LEVOCETIRIZINE DIHYDROCHLORIDE 5 MG PO TABS: ORAL_TABLET | ORAL | 2 refills | Status: DC

## 2023-04-26 MED ORDER — PULMICORT FLEXHALER 180 MCG/ACT IN AEPB: 1.0000 | INHALATION_SPRAY | Freq: Two times a day (BID) | RESPIRATORY_TRACT | 1 refills | Status: DC

## 2023-04-26 MED ORDER — AIRSUPRA 90-80 MCG/ACT IN AERO: 2.0000 | INHALATION_SPRAY | RESPIRATORY_TRACT | 3 refills | Status: DC | PRN

## 2023-04-26 NOTE — Addendum Note (Signed)
Addended by: [place]esto Charon on: 04/26/2023 02:58 PM   Modules accepted: Orders

## 2023-04-26 NOTE — Patient Instructions (Addendum)
Moderate Persistent Asthma- -Your breathing test looked great! - Controller Inhaler: Pulmicort flexhaler 180 mcg, 1 puff twice a day This Should Be Used Everyday - Rinse mouth out after use SICK PLAN: Increase Pulmicort flexhaler 180 mcg 2 puffs twice a day for 2 weeks or until symptoms improve. Also use Airsupra 2 puffs every 4 hours while awake for 3 days. If no improvement,  please schedule follow-up. - Rescue Inhaler:  Airsupra 2 puffs . Use as needed for chest tightness, wheezing, or coughing.  Can also use 15 minutes prior to exercise if you have symptoms with activity. Maximum 12 puffs/day. - Asthma is not controlled if:  - Symptoms are occurring >2 times a week OR  - >2 times a month nighttime awakenings  - You are requiring systemic steroids (prednisone/steroid injections) more than once per year  - Your require hospitalization for your asthma.  - Please call the clinic to schedule a follow up if these symptoms arise  Allergic rhinitis: - allergy avoidance toward dust mites, cat, horse, mouse, molds - will avoid nasal sprays for now - Continue over the counter antihistamine daily or daily as needed.   Use a NON-DROWSY option so that you can take the full amount. -Xyzal (Levocetirinze) 5mg  in PM as needed - Zyrtec (cetirizine) 10 mg in AM as needed - Continue Mucinex 600 mg twice a day as needed, drink plenty of water - allergy injections-this will teach your immune system to be tolerant of what you allergic to, if you decide to do this, call us for scheduling  History of allergic reaction, unknown cause:  -Food allergy testing today was negative -Please let us know if this recurs  It was a pleasure seeing you again in clinic today! Follow-up in 6 months, sooner if needed.   DUST MITE AVOIDANCE MEASURES:  There are three main measures that need and can be taken to avoid house dust mites:  Reduce accumulation of dust in general -reduce furniture, clothing, carpeting, books,  stuffed animals, especially in bedroom  Separate yourself from the dust -use pillow and mattress encasements (can be found at stores such as [employer] or online) -avoid direct exposure to air condition flow -use a HEPA filter device, especially in the bedroom; you can also use a HEPA filter vacuum cleaner -wipe dust with a moist towel instead of a dry towel or broom when cleaning  Decrease mites and/or their secretions -wash clothing and linen and stuffed animals at highest temperature possible, at least every 2 weeks -stuffed animals can also be placed in a bag and put in a freezer overnight  Despite the above measures, it is impossible to eliminate dust mites or their allergen completely from your home.  With the above measures the burden of mites in your home can be diminished, with the goal of minimizing your allergic symptoms.  Success will be reached only when implementing and using all means together.  Control of Mold Allergen   Mold and fungi can grow on a variety of surfaces provided certain temperature and moisture conditions exist.  Outdoor molds grow on plants, decaying vegetation and soil.  The major outdoor mold, Alternaria and Cladosporium, are found in very high numbers during hot and dry conditions.  Generally, a late Summer - Fall peak is seen for common outdoor fungal spores.  Rain will temporarily lower outdoor mold spore count, but counts rise rapidly when the rainy period ends.  The most important indoor molds are Aspergillus and Penicillium.  Dark, humid and poorly ventilated basements are ideal sites for mold growth.  The next most common sites of mold growth are the bathroom and the kitchen.  Outdoor (Seasonal) Mold Control  Use air conditioning and keep windows closed Avoid exposure to decaying vegetation. Avoid leaf raking. Avoid grain handling. Consider wearing a face mask if working in moldy areas.    Indoor (Perennial) Mold Control   Maintain  humidity below 50%. Clean washable surfaces with 5% bleach solution. Remove sources e.g. contaminated carpets.   Control of Dog or Cat Allergen  Avoidance is the best way to manage a dog or cat allergy. If you have a dog or cat and are allergic to dog or cats, consider removing the dog or cat from the home. If you have a dog or cat but don't want to find it a new home, or if your family wants a pet even though someone in the household is allergic, here are some strategies that may help keep symptoms at bay:  Keep the pet out of your bedroom and restrict it to only a few rooms. Be advised that keeping the dog or cat in only one room will not limit the allergens to that room. Don't pet, hug or kiss the dog or cat; if you do, wash your hands with soap and water. High-efficiency particulate air (HEPA) cleaners run continuously in a bedroom or living room can reduce allergen levels over time. Regular use of a high-efficiency vacuum cleaner or a central vacuum can reduce allergen levels. Giving your dog or cat a bath at least once a week can reduce airborne allergen.   Tonny Bollman, MD [facility]

## 2023-05-20 ENCOUNTER — Other Ambulatory Visit: Payer: Self-pay | Admitting: Internal Medicine

## 2023-05-20 NOTE — Telephone Encounter (Signed)
Please send as 90 day supply

## 2023-08-23 ENCOUNTER — Other Ambulatory Visit: Payer: Self-pay | Admitting: Family Medicine

## 2023-08-23 DIAGNOSIS — Z1212 Encounter for screening for malignant neoplasm of rectum: Secondary | ICD-10-CM

## 2023-08-23 DIAGNOSIS — Z1211 Encounter for screening for malignant neoplasm of colon: Secondary | ICD-10-CM

## 2023-09-01 LAB — COLOGUARD

## 2023-09-24 LAB — COLOGUARD: COLOGUARD: NEGATIVE

## 2023-10-31 ENCOUNTER — Ambulatory Visit: Payer: BC Managed Care – PPO | Admitting: Internal Medicine

## 2023-11-28 NOTE — Progress Notes (Signed)
 FOLLOW UP Date of Service/Encounter:  11/29/23  Subjective:  Mark Friedman. (DOB: July 22, 1971) is a 53 y.o. male who [facility] in re-evaluation of the following: asthma, allergic rhinitis  History obtained from: chart review and patient.  For Review, LV was on 04/26/23  with Dr.Bing Duffey seen for routine follow-up. See below for summary of history and diagnostics.   Therapeutic plans/changes recommended: Fev1 89%, continued on current meds ----------------------------------------------------- Pertinent History/Diagnostics:  Asthma:  diagnosed in childhood, hospitalized multiple times as a child,  Identified Triggers:  Allergens and irritants and respiratory illness Not interested in singulair. - pre/post spirometry (12/25/21): ratio 110%, 84% FEV1 (pre), + 9% (92%) FEV1 (post), + 19% increase in FVC Current plan: pulmicort  180, 1 puff BID, airsupra  PRN Allergic Rhinitis:  Was on allergy  shots, stopped 20 years ago (1998), did these when he lived in [place] -they were helpful to him. He is opposed to nasal sprays. Not interested in AIT  -blood drawl 2018-grasses negative, molds positive for Aspergillus and Candida , animal panel positive for mouse, nut panel negative  -01/25/2022: SPT positive to dust mites, cat, horse, mouse Current meds: xyzal  or other OTC AH daily PRN History of previous positive food allergy  test in youth, not avoiding any specific foods.  SPT negative to most common food allergens. -Blood drawl on 01/30/2022-positive to dust mites, molds, cat, dog mouse.  Total IgE 248 --------------------------------------------------- Today presents for follow-up. Discussed the use of AI scribe software for clinical note transcription with the patient, who gave verbal consent to proceed.  History of Present Illness   The patient, with a history of allergies and asthma, reports increased exposure to dust and dirt at work, which seems to  exacerbate his symptoms. He also has three dogs at home, which he acknowledges may contribute to his allergies, but he is unwilling to part with them. He alternates between taking Xyzal  and Zyrtec  for his allergies. He is not   The patient also uses an inhaler, Airsupra , more frequently than his other inhaler, Pulmicort , which he often forgets to take in the morning. He admits to struggling with the daily use of Pulmicort , but tries to use it at least once a day. If he misses a dose of Pulmicort , he uses Airsupra  instead.  He has not needed antibiotics or steroids since his last visit. When he fell ill during the holidays, he managed his symptoms with vitamins and over-the-counter remedies, rather than seeking medical attention. He takes a variety of supplements daily, including echinacea, zinc, and vitamin D3, which he believes help his overall health. He also takes turmeric for arthritis.  The patient acknowledges that he needs to work on his weight. He has previously been successful in losing weight, but has recently noticed a gradual increase. His goal is to r interested in allergy  injections.  Eturn to a weight of around 200 pounds. He believes this is a healthy weight for him, despite what medical guidelines might suggest.    All medications reviewed by clinical staff and updated in chart. No new pertinent medical or surgical history except as noted in HPI.  ROS: All others negative except as noted per HPI.   Objective:  BP 132/86   Pulse 72   Temp (!) 97.2 F (36.2 C) (Temporal)   Resp 18   Wt 273 lb 8 oz (124.1 kg)   SpO2 97%   BMI 40.39 kg/m  Body mass index is 40.39 kg/m. Physical Exam: General  Appearance:  Alert, cooperative, no distress, appears stated age  Head:  Normocephalic, without obvious abnormality, atraumatic  Eyes:  Conjunctiva clear, EOM's intact  Ears EACs normal bilaterally and normal TMs bilaterally  Nose: Nares normal, hypertrophic turbinates, normal mucosa,  and no visible anterior polyps  Throat: Lips, tongue normal; teeth and gums normal, normal posterior oropharynx  Neck: Supple, symmetrical  Lungs:   clear to auscultation bilaterally, Respirations unlabored, no coughing  Heart:  regular rate and rhythm and no murmur, Appears well perfused  Extremities: No edema  Skin: Skin color, texture, turgor normal and no rashes or lesions on visualized portions of skin  Neurologic: No gross deficits   Labs:  Lab Orders  No laboratory test(s) ordered today    Spirometry:  Tracings reviewed. His effort: Good reproducible efforts. FVC: 5.28L FEV1: 4.14L, 108% predicted FEV1/FVC ratio: 0.78 Interpretation: Spirometry consistent with normal pattern.  Please see scanned spirometry results for details.  Assessment/Plan   Moderate Persistent Asthma-at goal -Your breathing test looked great! - Controller Inhaler: Pulmicort  flexhaler 180 mcg, 1 puff twice a day This Should Be Used Everyday - Rinse mouth out after use SICK PLAN: Increase Pulmicort  flexhaler 180 mcg 2 puffs twice a day for 2 weeks or until symptoms improve. Also use Airsupra  2 puffs every 4 hours while awake for 3 days. If no improvement,  please schedule follow-up. - Rescue Inhaler: Airsupra  2 puffs. Use as needed for chest tightness, wheezing, or coughing.  Can also use 15 minutes prior to exercise if you have symptoms with activity. Maximum 12 puffs/day. - Asthma is not controlled if:  - Symptoms are occurring >2 times a week OR  - >2 times a month nighttime awakenings  - You are requiring systemic steroids (prednisone /steroid injections) more than once per year  - Your require hospitalization for your asthma.  - Please call the clinic to schedule a follow up if these symptoms arise  Allergic rhinitis: not at goal - allergy  avoidance toward dust mites, cat, horse, mouse, molds - will avoid nasal sprays for now - Continue over the counter antihistamine daily or daily as needed.    Use a NON-DROWSY option so that you can take the full amount. -Xyzal  (Levocetirinze) 5mg  in PM as needed - Zyrtec  (cetirizine ) 10 mg in AM as needed - Continue Mucinex  600 mg twice a day as needed, drink plenty of water - allergy  injections-this will teach your immune system to be tolerant of what you allergic to, if you decide to do this, call us  for scheduling  History of allergic reaction, unknown cause: stable -Food allergy  testing 01/30/22 was negative -Please let us  know if this recurs  It was a pleasure seeing you again in clinic today! Follow-up in 6 months, sooner if needed.   Other: none  Rocky Endow, MD  [facility] 

## 2023-11-29 ENCOUNTER — Ambulatory Visit (INDEPENDENT_AMBULATORY_CARE_PROVIDER_SITE_OTHER): Payer: BC Managed Care – PPO | Admitting: Internal Medicine

## 2023-11-29 ENCOUNTER — Encounter: Payer: Self-pay | Admitting: Internal Medicine

## 2023-11-29 ENCOUNTER — Ambulatory Visit: Payer: BC Managed Care – PPO | Admitting: Internal Medicine

## 2023-11-29 VITALS — BP 132/86 | HR 72 | Temp 97.2°F | Resp 18 | Wt 273.5 lb

## 2023-11-29 DIAGNOSIS — Z9109 Other allergy status, other than to drugs and biological substances: Secondary | ICD-10-CM

## 2023-11-29 DIAGNOSIS — J4540 Moderate persistent asthma, uncomplicated: Secondary | ICD-10-CM | POA: Diagnosis not present

## 2023-11-29 DIAGNOSIS — J3089 Other allergic rhinitis: Secondary | ICD-10-CM | POA: Diagnosis not present

## 2023-11-29 MED ORDER — AIRSUPRA 90-80 MCG/ACT IN AERO: 2.0000 | INHALATION_SPRAY | RESPIRATORY_TRACT | 1 refills | Status: DC | PRN

## 2023-11-29 MED ORDER — PULMICORT FLEXHALER 180 MCG/ACT IN AEPB: 1.0000 | INHALATION_SPRAY | Freq: Two times a day (BID) | RESPIRATORY_TRACT | 1 refills | Status: DC

## 2023-11-29 MED ORDER — LEVOCETIRIZINE DIHYDROCHLORIDE 5 MG PO TABS
ORAL_TABLET | ORAL | 2 refills | Status: DC
Start: 2023-11-29 — End: 2023-11-29

## 2023-11-29 MED ORDER — LEVOCETIRIZINE DIHYDROCHLORIDE 5 MG PO TABS: ORAL_TABLET | ORAL | 2 refills | Status: DC

## 2023-11-29 NOTE — Patient Instructions (Addendum)
 Moderate Persistent Asthma- -Your breathing test looked great! - Controller Inhaler: Pulmicort  flexhaler 180 mcg, 1 puff twice a day This Should Be Used Everyday - Rinse mouth out after use SICK PLAN: Increase Pulmicort  flexhaler 180 mcg 2 puffs twice a day for 2 weeks or until symptoms improve. Also use Airsupra  2 puffs every 4 hours while awake for 3 days. If no improvement,  please schedule follow-up. - Rescue Inhaler:  Airsupra  2 puffs . Use as needed for chest tightness, wheezing, or coughing.  Can also use 15 minutes prior to exercise if you have symptoms with activity. Maximum 12 puffs/day. - Asthma is not controlled if:  - Symptoms are occurring >2 times a week OR  - >2 times a month nighttime awakenings  - You are requiring systemic steroids (prednisone /steroid injections) more than once per year  - Your require hospitalization for your asthma.  - Please call the clinic to schedule a follow up if these symptoms arise  Allergic rhinitis: - allergy  avoidance toward dust mites, cat, horse, mouse, molds - will avoid nasal sprays for now - Continue over the counter antihistamine daily or daily as needed.   Use a NON-DROWSY option so that you can take the full amount. -Xyzal  (Levocetirinze) 5mg  in PM as needed - Zyrtec  (cetirizine ) 10 mg in AM as needed - Continue Mucinex  600 mg twice a day as needed, drink plenty of water - allergy  injections-this will teach your immune system to be tolerant of what you allergic to, if you decide to do this, call us  for scheduling  History of allergic reaction, unknown cause:  -Food allergy  testing 01/30/22 was negative -Please let us  know if this recurs  It was a pleasure seeing you again in clinic today! Follow-up in 6 months, sooner if needed.   DUST MITE AVOIDANCE MEASURES:  There are three main measures that need and can be taken to avoid house dust mites:  Reduce accumulation of dust in general -reduce furniture, clothing, carpeting,  books, stuffed animals, especially in bedroom  Separate yourself from the dust -use pillow and mattress encasements (can be found at stores such as [employer] or online) -avoid direct exposure to air condition flow -use a HEPA filter device, especially in the bedroom; you can also use a HEPA filter vacuum cleaner -wipe dust with a moist towel instead of a dry towel or broom when cleaning  Decrease mites and/or their secretions -wash clothing and linen and stuffed animals at highest temperature possible, at least every 2 weeks -stuffed animals can also be placed in a bag and put in a freezer overnight  Despite the above measures, it is impossible to eliminate dust mites or their allergen completely from your home.  With the above measures the burden of mites in your home can be diminished, with the goal of minimizing your allergic symptoms.  Success will be reached only when implementing and using all means together.  Control of Mold Allergen   Mold and fungi can grow on a variety of surfaces provided certain temperature and moisture conditions exist.  Outdoor molds grow on plants, decaying vegetation and soil.  The major outdoor mold, Alternaria and Cladosporium, are found in very high numbers during hot and dry conditions.  Generally, a late Summer - Fall peak is seen for common outdoor fungal spores.  Rain will temporarily lower outdoor mold spore count, but counts rise rapidly when the rainy period ends.  The most important indoor molds are Aspergillus and Penicillium.  Dark, humid and poorly ventilated basements are ideal sites for mold growth.  The next most common sites of mold growth are the bathroom and the kitchen.  Outdoor (Seasonal) Mold Control  Use air conditioning and keep windows closed Avoid exposure to decaying vegetation. Avoid leaf raking. Avoid grain handling. Consider wearing a face mask if working in moldy areas.    Indoor (Perennial) Mold Control    Maintain humidity below 50%. Clean washable surfaces with 5% bleach solution. Remove sources e.g. contaminated carpets.   Control of Dog or Cat Allergen  Avoidance is the best way to manage a dog or cat allergy . If you have a dog or cat and are allergic to dog or cats, consider removing the dog or cat from the home. If you have a dog or cat but don't want to find it a new home, or if your family wants a pet even though someone in the household is allergic, here are some strategies that may help keep symptoms at bay:  Keep the pet out of your bedroom and restrict it to only a few rooms. Be advised that keeping the dog or cat in only one room will not limit the allergens to that room. Don't pet, hug or kiss the dog or cat; if you do, wash your hands with soap and water. High-efficiency particulate air (HEPA) cleaners run continuously in a bedroom or living room can reduce allergen levels over time. Regular use of a high-efficiency vacuum cleaner or a central vacuum can reduce allergen levels. Giving your dog or cat a bath at least once a week can reduce airborne allergen.   Rocky Endow, MD [facility]

## 2024-05-29 ENCOUNTER — Ambulatory Visit: Payer: BC Managed Care – PPO | Admitting: Internal Medicine

## 2024-06-24 ENCOUNTER — Ambulatory Visit: Admitting: Internal Medicine

## 2024-07-03 ENCOUNTER — Encounter: Payer: Self-pay | Admitting: Internal Medicine

## 2024-07-03 ENCOUNTER — Ambulatory Visit (INDEPENDENT_AMBULATORY_CARE_PROVIDER_SITE_OTHER): Admitting: Internal Medicine

## 2024-07-03 ENCOUNTER — Other Ambulatory Visit: Payer: Self-pay

## 2024-07-03 ENCOUNTER — Telehealth: Payer: Self-pay

## 2024-07-03 VITALS — BP 134/75 | HR 64 | Temp 96.3°F | Resp 20 | Wt 254.4 lb

## 2024-07-03 DIAGNOSIS — J3089 Other allergic rhinitis: Secondary | ICD-10-CM | POA: Diagnosis not present

## 2024-07-03 DIAGNOSIS — J320 Chronic maxillary sinusitis: Secondary | ICD-10-CM

## 2024-07-03 DIAGNOSIS — J4540 Moderate persistent asthma, uncomplicated: Secondary | ICD-10-CM | POA: Diagnosis not present

## 2024-07-03 MED ORDER — CETIRIZINE HCL 10 MG PO TABS: 10.0000 mg | ORAL_TABLET | Freq: Every day | ORAL | 5 refills | Status: AC | PRN

## 2024-07-03 MED ORDER — PULMICORT FLEXHALER 180 MCG/ACT IN AEPB: INHALATION_SPRAY | RESPIRATORY_TRACT | 1 refills | Status: AC

## 2024-07-03 MED ORDER — AIRSUPRA 90-80 MCG/ACT IN AERO: 2.0000 | INHALATION_SPRAY | RESPIRATORY_TRACT | 1 refills | Status: AC | PRN

## 2024-07-03 NOTE — Patient Instructions (Addendum)
 Moderate Persistent Asthma-at goal -Your breathing test looked great! SICK PLAN: Start Pulmicort  flexhaler 180 mcg 2 puffs twice a day for 1-2 weeks or until symptoms improve. Also use Airsupra  2 puffs every 4 hours while awake for 3 days. If no improvement,  please schedule follow-up. - Rescue Inhaler: Airsupra  2 puffs. Use as needed for chest tightness, wheezing, or coughing.  Can also use 15 minutes prior to exercise if you have symptoms with activity. Maximum 12 puffs/day. - Asthma is not controlled if:  - Symptoms are occurring >2 times a week OR  - >2 times a month nighttime awakenings  - You are requiring systemic steroids (prednisone /steroid injections) more than once per year  - Your require hospitalization for your asthma.  - Please call the clinic to schedule a follow up if these symptoms arise  Allergic rhinitis: having sinus issues, recent tooth extract - recommend follow-up with dentist - use saline rinses to help clear out sinuses - allergy  avoidance toward dust mites, cat, horse, mouse, molds - will avoid nasal sprays for now per patient preference - Continue over the counter antihistamine daily or daily as needed.   Use a NON-DROWSY option so that you can take the full amount. - Your options include: Zyrtec  (cetirizine ) 10 mg, Claritin (loratadine) 10 mg, Xyzal  (levocetirizine) 5 mg or Allegra (fexofenadine) 180 mg daily as needed - Continue Mucinex  600 mg twice a day as needed, drink plenty of water - allergy  injections-this will teach your immune system to be tolerant of what you allergic to, if you decide to do this, call us  for scheduling  History of allergic reaction, unknown cause:  -Food allergy  testing 01/30/22 was negative -Please let us  know if this recurs  It was a pleasure seeing you again in clinic today! Follow-up in 12 months, sooner if needed.   DUST MITE AVOIDANCE MEASURES:  There are three main measures that need and can be taken to avoid house dust  mites:  Reduce accumulation of dust in general -reduce furniture, clothing, carpeting, books, stuffed animals, especially in bedroom  Separate yourself from the dust -use pillow and mattress encasements (can be found at stores such as [employer] or online) -avoid direct exposure to air condition flow -use a HEPA filter device, especially in the bedroom; you can also use a HEPA filter vacuum cleaner -wipe dust with a moist towel instead of a dry towel or broom when cleaning  Decrease mites and/or their secretions -wash clothing and linen and stuffed animals at highest temperature possible, at least every 2 weeks -stuffed animals can also be placed in a bag and put in a freezer overnight  Despite the above measures, it is impossible to eliminate dust mites or their allergen completely from your home.  With the above measures the burden of mites in your home can be diminished, with the goal of minimizing your allergic symptoms.  Success will be reached only when implementing and using all means together.  Control of Mold Allergen   Mold and fungi can grow on a variety of surfaces provided certain temperature and moisture conditions exist.  Outdoor molds grow on plants, decaying vegetation and soil.  The major outdoor mold, Alternaria and Cladosporium, are found in very high numbers during hot and dry conditions.  Generally, a late Summer - Fall peak is seen for common outdoor fungal spores.  Rain will temporarily lower outdoor mold spore count, but counts rise rapidly when the rainy period ends.  The most important  indoor molds are Aspergillus and Penicillium.  Dark, humid and poorly ventilated basements are ideal sites for mold growth.  The next most common sites of mold growth are the bathroom and the kitchen.  Outdoor (Seasonal) Mold Control  Use air conditioning and keep windows closed Avoid exposure to decaying vegetation. Avoid leaf raking. Avoid grain handling. Consider  wearing a face mask if working in moldy areas.    Indoor (Perennial) Mold Control   Maintain humidity below 50%. Clean washable surfaces with 5% bleach solution. Remove sources e.g. contaminated carpets.   Control of Dog or Cat Allergen  Avoidance is the best way to manage a dog or cat allergy . If you have a dog or cat and are allergic to dog or cats, consider removing the dog or cat from the home. If you have a dog or cat but don't want to find it a new home, or if your family wants a pet even though someone in the household is allergic, here are some strategies that may help keep symptoms at bay:  Keep the pet out of your bedroom and restrict it to only a few rooms. Be advised that keeping the dog or cat in only one room will not limit the allergens to that room. Don't pet, hug or kiss the dog or cat; if you do, wash your hands with soap and water. High-efficiency particulate air (HEPA) cleaners run continuously in a bedroom or living room can reduce allergen levels over time. Regular use of a high-efficiency vacuum cleaner or a central vacuum can reduce allergen levels. Giving your dog or cat a bath at least once a week can reduce airborne allergen.   Rocky Endow, MD [facility]

## 2024-07-03 NOTE — Progress Notes (Signed)
 FOLLOW UP Date of Service/Encounter:   07/03/2024  Subjective:  Mark Friedman. (DOB: 1971/05/14) is a 53 y.o. male who [facility] in re-evaluation of the following: asthma, allergic rhinitis, hx of allergic reaction History obtained from: chart review and patient.  For Review, LV was on 11/29/23  with Dr.Anaysia Germer seen for routine follow-up. See below for summary of history and diagnostics.   Therapeutic plans/changes recommended: doing well, no changes ----------------------------------------------------- Pertinent History/Diagnostics:  Asthma:  diagnosed in childhood, hospitalized multiple times as a child,  Identified Triggers:  Allergens and irritants and respiratory illness Not interested in singulair. - pre/post spirometry (12/25/21): ratio 110%, 84% FEV1 (pre), + 9% (92%) FEV1 (post), + 19% increase in FVC Current plan: pulmicort  180, 1 puff BID, airsupra  PRN Allergic Rhinitis:  Was on allergy  shots, stopped 20 years ago (1998), did these when he lived in [place] -they were helpful to him. He is opposed to nasal sprays. Not interested in AIT  -blood drawl 2018-grasses negative, molds positive for Aspergillus and Candida , animal panel positive for mouse, nut panel negative  -01/25/2022: SPT positive to dust mites, cat, horse, mouse Current meds: xyzal  or other OTC AH daily PRN History of previous positive food allergy  test in youth, not avoiding any specific foods.  SPT negative to most common food allergens. -Blood drawl on 01/30/2022-positive to dust mites, molds, cat, dog mouse.  Total IgE 248 --------------------------------------------------- Today presents for follow-up. Discussed the use of AI scribe software for clinical note transcription with the patient, who gave verbal consent to proceed.  History of Present Illness Mark Friedman. Lemond is a 53 year old male who presents for follow-up of respiratory and sinus  issues.  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 - Left nasal obstruction described as 'stopped up' - Long-standing problem with sinus symptoms - History of dental procedure with bone packing and stitches on the left side, now the side with most symptoms - Concern for possible infection in the left sinus region - Occasional use of saline rinses for symptom relief  Allergic symptoms and medication use - Uses generic cetirizine  from [employer] as needed for allergy  symptoms - Avoids Xyzal  at night due to drowsiness - Prefers to minimize medication use and favors natural remedies - Takes vitamins regularly - Previously used ivermectin when feeling unwell, but currently does not have any-unclear who was prescribing this for him -requesting referral to ENT - not interested in allergy  injections as feels his own body will eventually tolerate his allergens  Dietary allergies and food tolerance - History of multiple childhood allergies, including wheat and dairy - Currently tolerates wheat and dairy after gradual reintroduction - Prefers to manage health through natural means and dietary changes - Expresses concern about challenges posed by modern food additives  All medications reviewed by clinical staff and updated in chart. No new pertinent medical or surgical history except as noted in HPI.  ROS: All others negative except as noted per HPI.   Objective:  BP 134/75   Pulse 64   Temp (!) 96.3 F (35.7  C) (Temporal)   Resp 20   Wt 254 lb 6.4 oz (115.4 kg)   SpO2 95%   BMI 37.57 kg/m   Body mass index is 37.57 kg/m. Physical Exam: General Appearance:  Alert, cooperative, no distress, appears stated age  Head:  Normocephalic, without obvious abnormality, atraumatic  Eyes:  Conjunctiva clear, EOM's intact  Ears EACs normal bilaterally and normal TMs bilaterally  Nose: Nares normal, hypertrophic turbinates, normal mucosa, and no visible anterior polyps  Throat: Lips, tongue normal; teeth and gums normal, normal posterior oropharynx  Neck: Supple, symmetrical  Lungs:   clear to auscultation bilaterally, Respirations unlabored, no coughing  Heart:  regular rate and rhythm and no murmur, Appears well perfused  Extremities: No edema  Skin: Skin color, texture, turgor normal and no rashes or lesions on visualized portions of skin  Neurologic: No gross deficits   Labs:  Lab Orders  No laboratory test(s) ordered today    Spirometry:  Tracings reviewed. His effort: Good reproducible efforts. FVC: 5.12L FEV1: 4.20L, 126% predicted FEV1/FVC ratio: 0.82 Interpretation: Spirometry consistent with normal pattern.  Please see scanned spirometry results for details.   Assessment/Plan   Moderate Persistent Asthma-at goal; noncompliance with recommended controller inhaler but doing well.  Stepping down to Airsupra  as needed and Pulmicort  during flares -Your breathing test looked great! SICK PLAN: Start Pulmicort  flexhaler 180 mcg 2 puffs twice a day for 1-2 weeks or until symptoms improve. Also use Airsupra  2 puffs every 4 hours while awake for 3 days. If no improvement,  please schedule follow-up. - Rescue Inhaler: Airsupra  2 puffs. Use as needed for chest tightness, wheezing, or coughing.  Can also use 15 minutes prior to exercise if you have symptoms with activity. Maximum 12 puffs/day. - Asthma is not controlled if:  - Symptoms are occurring >2 times a week OR  - >2 times a month nighttime awakenings  - You are requiring systemic steroids (prednisone /steroid injections)  more than once per year  - Your require hospitalization for your asthma.  - Please call the clinic to schedule a follow up if these symptoms arise  Allergic rhinitis: having sinus issues, recent tooth extract, not interested in allergy  injections.  Requesting referral to ENT - recommend follow-up with dentist - use saline rinses to help clear out sinuses - allergy  avoidance toward dust mites, cat, horse, mouse, molds - will avoid nasal sprays for now per patient preference - Continue over the counter antihistamine daily or daily as needed.   Use a NON-DROWSY option so that you can take the full amount. - Your options include: Zyrtec  (cetirizine ) 10 mg, Claritin (loratadine) 10 mg, Xyzal  (levocetirizine) 5 mg or Allegra (fexofenadine) 180 mg daily as needed - Continue Mucinex  600 mg twice a day as needed, drink plenty of water - allergy  injections-this will teach your immune system to be tolerant of what you allergic to, if you decide to do this, call us  for scheduling  History of allergic reaction, unknown cause: None since last visit -Food allergy  testing 01/30/22 was negative -Please let us  know if this recurs  It was a pleasure seeing you again in clinic today! Follow-up in 12 months, sooner if needed.   Other: none  Rocky Endow, MD  [facility] 

## 2024-07-03 NOTE — Telephone Encounter (Signed)
 Patient would like non drowsy antihistamine to mail order pharmacy and a referral to ENT please. Answered all questions

## 2024-07-03 NOTE — Telephone Encounter (Signed)
 He has commercial so unlikely any will be covered as they are all OTC. I sent Zyrtec  as he said Xyzal  made him sleepy. It doesn't look to be covered. He was referred to ENT at his request.

## 2024-07-06 NOTE — Telephone Encounter (Signed)
 Noted

## 2024-07-10 ENCOUNTER — Encounter (INDEPENDENT_AMBULATORY_CARE_PROVIDER_SITE_OTHER): Payer: Self-pay

## 2024-08-19 ENCOUNTER — Encounter (INDEPENDENT_AMBULATORY_CARE_PROVIDER_SITE_OTHER): Payer: Self-pay
